# Patient Record
Sex: Male | Born: 1950 | Race: Black or African American | Hispanic: No | Marital: Married | Smoking: Never smoker
Health system: Southern US, Community
[De-identification: ages and names within clinical notes are randomized; demographics above are authoritative.]

## PROBLEM LIST (undated history)

## (undated) DIAGNOSIS — I10 Essential (primary) hypertension: Secondary | ICD-10-CM

## (undated) DIAGNOSIS — E119 Type 2 diabetes mellitus without complications: Secondary | ICD-10-CM

## (undated) DIAGNOSIS — E785 Hyperlipidemia, unspecified: Secondary | ICD-10-CM

## (undated) HISTORY — PX: ROTATOR CUFF REPAIR: SHX139

## (undated) HISTORY — PX: KNEE SURGERY: SHX244

## (undated) HISTORY — PX: OTHER SURGICAL HISTORY: SHX169

---

## 2007-11-19 ENCOUNTER — Ambulatory Visit: Payer: Self-pay | Admitting: Family Medicine

## 2010-11-01 ENCOUNTER — Ambulatory Visit: Payer: Self-pay | Admitting: Internal Medicine

## 2018-03-11 ENCOUNTER — Emergency Department: Payer: Medicare Other

## 2018-03-11 ENCOUNTER — Other Ambulatory Visit: Payer: Self-pay

## 2018-03-11 ENCOUNTER — Observation Stay
Admission: EM | Admit: 2018-03-11 | Discharge: 2018-03-12 | Disposition: A | Discharge status: Home / Self Care | Payer: Medicare Other | Attending: Internal Medicine | Admitting: Internal Medicine

## 2018-03-11 ENCOUNTER — Encounter: Payer: Self-pay | Admitting: Emergency Medicine

## 2018-03-11 DIAGNOSIS — R079 Chest pain, unspecified: Secondary | ICD-10-CM | POA: Diagnosis not present

## 2018-03-11 DIAGNOSIS — Z79899 Other long term (current) drug therapy: Secondary | ICD-10-CM | POA: Insufficient documentation

## 2018-03-11 DIAGNOSIS — E119 Type 2 diabetes mellitus without complications: Secondary | ICD-10-CM | POA: Insufficient documentation

## 2018-03-11 DIAGNOSIS — E782 Mixed hyperlipidemia: Secondary | ICD-10-CM | POA: Insufficient documentation

## 2018-03-11 DIAGNOSIS — Z7984 Long term (current) use of oral hypoglycemic drugs: Secondary | ICD-10-CM | POA: Diagnosis not present

## 2018-03-11 DIAGNOSIS — R0789 Other chest pain: Secondary | ICD-10-CM | POA: Diagnosis present

## 2018-03-11 DIAGNOSIS — K219 Gastro-esophageal reflux disease without esophagitis: Secondary | ICD-10-CM | POA: Diagnosis not present

## 2018-03-11 DIAGNOSIS — I10 Essential (primary) hypertension: Secondary | ICD-10-CM | POA: Diagnosis not present

## 2018-03-11 DIAGNOSIS — R0989 Other specified symptoms and signs involving the circulatory and respiratory systems: Secondary | ICD-10-CM

## 2018-03-11 HISTORY — DX: Essential (primary) hypertension: I10

## 2018-03-11 HISTORY — DX: Hyperlipidemia, unspecified: E78.5

## 2018-03-11 LAB — CBC
HCT: 39.2 % (ref 39.0–52.0)
Hemoglobin: 12.4 g/dL — ABNORMAL LOW (ref 13.0–17.0)
MCH: 31.4 pg (ref 26.0–34.0)
MCHC: 31.6 g/dL (ref 30.0–36.0)
MCV: 99.2 fL (ref 80.0–100.0)
Platelets: 184 10*3/uL (ref 150–400)
RBC: 3.95 MIL/uL — ABNORMAL LOW (ref 4.22–5.81)
RDW: 12 % (ref 11.5–15.5)
WBC: 10.1 10*3/uL (ref 4.0–10.5)
nRBC: 0 % (ref 0.0–0.2)

## 2018-03-11 LAB — BASIC METABOLIC PANEL
Anion gap: 7 (ref 5–15)
BUN: 20 mg/dL (ref 8–23)
CO2: 26 mmol/L (ref 22–32)
Calcium: 8.7 mg/dL — ABNORMAL LOW (ref 8.9–10.3)
Chloride: 105 mmol/L (ref 98–111)
Creatinine, Ser: 0.89 mg/dL (ref 0.61–1.24)
GFR calc Af Amer: >60 mL/min (ref >60)
GLUCOSE: 192 mg/dL — AB (ref 70–99)
POTASSIUM: 3.5 mmol/L (ref 3.5–5.1)
Sodium: 138 mmol/L (ref 135–145)

## 2018-03-11 LAB — HEPATIC FUNCTION PANEL
ALK PHOS: 60 U/L (ref 38–126)
ALT: 27 U/L (ref 0–44)
AST: 26 U/L (ref 15–41)
Albumin: 4 g/dL (ref 3.5–5.0)
BILIRUBIN DIRECT: 0.1 mg/dL (ref 0.0–0.2)
BILIRUBIN INDIRECT: 0.6 mg/dL (ref 0.3–0.9)
BILIRUBIN TOTAL: 0.7 mg/dL (ref 0.3–1.2)
Total Protein: 7.3 g/dL (ref 6.5–8.1)

## 2018-03-11 LAB — TROPONIN I

## 2018-03-11 LAB — LIPASE, BLOOD: Lipase: 31 U/L (ref 11–51)

## 2018-03-11 MED ORDER — DIAZEPAM 2 MG PO TABS: 2.0000 mg | ORAL_TABLET | Freq: Once | ORAL | Status: DC

## 2018-03-11 MED ORDER — LIDOCAINE HCL (PF) 1 % IJ SOLN
0.50 | INTRAMUSCULAR | Status: DC
Start: ? — End: 2018-03-11

## 2018-03-11 MED ORDER — ASPIRIN 81 MG PO CHEW
CHEWABLE_TABLET | ORAL | Status: AC
  Administered 2018-03-11: 324 mg via ORAL
  Filled 2018-03-11: qty 4

## 2018-03-11 MED ORDER — HYDROMORPHONE HCL 1 MG/ML IJ SOLN
INTRAMUSCULAR | Status: AC
  Administered 2018-03-11: 1 mg via INTRAVENOUS
  Filled 2018-03-11: qty 1

## 2018-03-11 MED ORDER — ONDANSETRON HCL 4 MG/2ML IJ SOLN
INTRAMUSCULAR | Status: AC
  Administered 2018-03-11: 4 mg via INTRAVENOUS
  Filled 2018-03-11: qty 2

## 2018-03-11 MED ORDER — FAMOTIDINE IN NACL 20-0.9 MG/50ML-% IV SOLN
20.0000 mg | Freq: Once | INTRAVENOUS | Status: AC
  Administered 2018-03-11: 20 mg via INTRAVENOUS

## 2018-03-11 MED ORDER — MORPHINE SULFATE (PF) 4 MG/ML IV SOLN
INTRAVENOUS | Status: AC
  Administered 2018-03-11: 4 mg via INTRAVENOUS
  Filled 2018-03-11: qty 1

## 2018-03-11 MED ORDER — FAMOTIDINE IN NACL 20-0.9 MG/50ML-% IV SOLN
INTRAVENOUS | Status: AC
  Administered 2018-03-11: 20 mg via INTRAVENOUS
  Filled 2018-03-11: qty 50

## 2018-03-11 MED ORDER — SODIUM CHLORIDE FLUSH 0.9 % IV SOLN
5.00 | INTRAVENOUS | Status: DC
Start: ? — End: 2018-03-11

## 2018-03-11 MED ORDER — HYDROMORPHONE HCL 1 MG/ML IJ SOLN
1.0000 mg | Freq: Once | INTRAMUSCULAR | Status: AC
  Administered 2018-03-11: 1 mg via INTRAVENOUS

## 2018-03-11 MED ORDER — ASPIRIN 81 MG PO CHEW
324.0000 mg | CHEWABLE_TABLET | Freq: Once | ORAL | Status: AC
  Administered 2018-03-11: 324 mg via ORAL

## 2018-03-11 MED ORDER — IOPAMIDOL (ISOVUE-370) INJECTION 76%
75.0000 mL | Freq: Once | INTRAVENOUS | Status: AC | PRN
  Administered 2018-03-11: 75 mL via INTRAVENOUS

## 2018-03-11 MED ORDER — ONDANSETRON HCL 4 MG/2ML IJ SOLN
4.0000 mg | Freq: Once | INTRAMUSCULAR | Status: AC
  Administered 2018-03-11: 4 mg via INTRAVENOUS

## 2018-03-11 MED ORDER — MORPHINE SULFATE (PF) 4 MG/ML IV SOLN
4.0000 mg | Freq: Once | INTRAVENOUS | Status: AC
  Administered 2018-03-11: 4 mg via INTRAVENOUS

## 2018-03-11 NOTE — ED Provider Notes (Signed)
[facility] Emergency Department Provider Note   ____________________________________________   First MD Initiated Contact with Patient 03/11/18 2312     (approximate)  I have reviewed the triage vital signs and the nursing notes.   HISTORY  Chief Complaint Chest Pain    HPI Tyrone Jarvis is a 67 y.o. male who presents to the ED from home with a chief complaint of chest pain.  Patient reports left chest pain onset approximately 8:30 PM while at rest.  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  Had similar pain on 10/29 where he was evaluated in the [facility] ED with negative troponins.  Last stress test 5 years ago which was normal per patient.  No personal history of coronary artery disease.  Denies recent fever, chills, shortness of breath, abdominal pain, nausea, vomiting.  Denies recent travel, trauma or hormone use.   Past Medical History:  Diagnosis Date  . Hyperlipidemia   . Hypertension     Patient Active Problem List   Diagnosis Date Noted  . Chest pain 03/12/2018    History reviewed. No pertinent surgical history.  Prior to Admission medications   Medication Sig Start Date End Date Taking? Authorizing Provider  cyanocobalamin (,VITAMIN B-12,) 1000 MCG/ML injection Inject 1 mL into the muscle every 30 (thirty) days. Takes on the 25th of each month 03/03/18  Yes [provider]  Fluocinolone Acetonide Scalp 0.01 % OIL Apply 1 application topically 2 (two) times daily. 01/28/18  Yes [provider]  ketoconazole (NIZORAL) 2 % cream Apply 1 application topically 2 (two) times daily. 03/03/18  Yes [provider]  latanoprost (XALATAN) 0.005 % ophthalmic solution Place 1 drop into both eyes at bedtime. 03/03/18  Yes [provider]  losartan (COZAAR) 25 MG tablet Take 25 mg by mouth daily. 03/06/18  Yes [provider]  meloxicam (MOBIC) 15 MG tablet Take 15 mg by mouth daily as needed for pain. 01/03/18  Yes [provider]  metFORMIN (GLUCOPHAGE) 500 MG tablet Take 500 mg by mouth daily with breakfast. 03/06/18  Yes [provider]  Multiple Vitamin (MULTIVITAMIN) tablet Take 1 tablet by mouth daily.   Yes [provider]  Omega-3 Fatty Acids (FISH OIL) 1000 MG CAPS Take 1 capsule by mouth 2 (two) times daily.   Yes [provider]  pantoprazole (PROTONIX) 40 MG tablet Take 40 mg by mouth daily. 03/10/18  Yes [provider]  rosuvastatin (CRESTOR) 5 MG tablet Take 5 mg by mouth at bedtime. 03/07/18  Yes [provider]  sildenafil (REVATIO) 20 MG tablet Take 20 mg by mouth daily as needed (erectile dysfunction).  03/03/18  Yes [provider]  traZODone (DESYREL) 50 MG tablet Take 50 mg by mouth at bedtime. 03/10/18  Yes [provider]  vitamin E 200 UNIT capsule Take 200 Units by mouth daily.   Yes [provider]    Allergies Patient has no known allergies.  Family history CAD  Social History Social History   Tobacco Use  . Smoking status: Never Smoker  . Smokeless tobacco: Never Used  Substance Use Topics  . Alcohol use: Never    Frequency: Never  . Drug use: Not on file    Review of Systems  Constitutional: No fever/chills Eyes: No visual changes. ENT: No sore throat. Cardiovascular: Positive for chest pain. Respiratory:  Denies shortness of breath. Gastrointestinal: No abdominal pain.  No nausea, no vomiting.  No diarrhea.  No constipation. Genitourinary: Negative for dysuria. Musculoskeletal: Negative for back pain. Skin: Negative for rash. Neurological: Negative for headaches, focal weakness or numbness.   ____________________________________________   PHYSICAL EXAM:  VITAL SIGNS: ED Triage Vitals  Enc Vitals Group     BP 03/11/18 2306 (!) 142/66     Pulse Rate 03/11/18 2306  88     Resp 03/11/18 2306 20     Temp 03/11/18 2306 97.9 F (36.6 C)     Temp Source 03/11/18 2306 Oral     SpO2 03/11/18 2306 98 %     Weight 03/11/18 2304 248 lb (112.5 kg)     Height 03/11/18 2304 6\' 3"  (1.905 m)     Head Circumference --      Peak Flow --      Pain Score 03/11/18 2304 10     Pain Loc --      Pain Edu? --      Excl. in GC? --     Constitutional: Alert and oriented. Well appearing and in mild to moderate acute distress. Eyes: Conjunctivae are normal. PERRL. EOMI. Head: Atraumatic. Nose: No congestion/rhinnorhea. Mouth/Throat: Mucous membranes are moist.  Oropharynx non-erythematous. Neck: No stridor.   Cardiovascular: Normal rate, regular rhythm. Grossly normal heart sounds.  Good peripheral circulation. Respiratory: Normal respiratory effort.  No retractions. Lungs CTAB.  Left anterior chest at pectoralis muscle tender to palpation and with movement of trunk. Gastrointestinal: Soft and nontender to light or deep palpation. No distention. No abdominal bruits. No CVA tenderness. Musculoskeletal: No lower extremity tenderness nor edema.  No joint effusions. Neurologic:  Normal speech and language. No gross focal neurologic deficits are appreciated. No gait instability. Skin:  Skin is warm, dry and intact. No rash noted. Psychiatric: Mood and affect are normal. Speech and behavior are normal.  ____________________________________________   LABS (all labs ordered are listed, but only abnormal results are displayed)  Labs Reviewed  BASIC METABOLIC PANEL - Abnormal; Notable for the following components:      Result Value   Glucose, Bld 192 (*)    Calcium 8.7 (*)    All other components within normal limits  CBC - Abnormal; Notable for the following components:   RBC 3.95 (*)    Hemoglobin 12.4 (*)    All other components within normal limits  TROPONIN I  HEPATIC FUNCTION PANEL  LIPASE, BLOOD  BRAIN NATRIURETIC PEPTIDE  CK  TSH  TROPONIN I  HEMOGLOBIN  A1C  TROPONIN I  TROPONIN I   ____________________________________________  EKG  ED ECG REPORT I, Analiya Porco J, the attending physician, personally viewed and interpreted this ECG.   Date: 03/11/2018  EKG Time: 2300  Rate: 84  Rhythm: normal EKG, normal sinus rhythm  Axis: Normal  Intervals:none  ST&T Change: T wave inversion in inferior leads EKG from 10/29 at [facility] interpreted as normal sinus rhythm, nonspecific T wave abnormalities, LVH (I am unable to directly visualize this EKG) ____________________________________________  RADIOLOGY  ED MD interpretation: Pulmonary vascular congestion: No PE  Official radiology report(s): Ct Angio Chest Pe W/cm &/or Wo Cm  Result Date: 03/12/2018 CLINICAL DATA:  Excruciating chest pain beginning at 2030 hours. EXAM: CT ANGIOGRAPHY CHEST WITH CONTRAST TECHNIQUE: Multidetector CT imaging of the chest was performed using the standard protocol during bolus administration of intravenous contrast. Multiplanar CT image reconstructions and MIPs were obtained to evaluate the vascular anatomy. CONTRAST:  75mL ISOVUE-370 IOPAMIDOL (ISOVUE-370) INJECTION 76% COMPARISON:  Chest radiograph March 11, 2018 FINDINGS: Mild respiratory motion degraded examination. CARDIOVASCULAR: Suboptimal contrast opacification of the pulmonary artery's (205 Hounsfield units, target is 250 Hounsfield units. Main pulmonary artery is not enlarged. No pulmonary arterial filling defects to the level of the subsegmental branches. Heart size is mildly enlarged. Mild coronary artery calcifications. Small pericardial effusion. Thoracic aorta is normal course and caliber, unremarkable. Pulmonary venous congestion. MEDIASTINUM/NODES: No lymphadenopathy by CT size criteria. Mild gas distended distal esophagus associated with reflux. LUNGS/PLEURA: Tracheobronchial tree is patent, no pneumothorax. Bibasilar atelectasis. No pleural effusion or focal consolidation. UPPER ABDOMEN: Non-acute.  MUSCULOSKELETAL: Non-acute.  ACDF.  The foot Review of the MIP images confirms the above findings. IMPRESSION: 1. No acute pulmonary embolism. 2. Mild cardiomegaly and mild pulmonary edema. Electronically Signed   By: Awilda Metro M.D.   On: 03/12/2018 00:28   Dg Chest Portable 1 View  Result Date: 03/11/2018 CLINICAL DATA:  Excruciating chest pain since 2030 hours tonight. EXAM: PORTABLE CHEST 1 VIEW COMPARISON:  11/01/2010 FINDINGS: Low lung volumes with borderline cardiomegaly. Nonaneurysmal thoracic aorta. There is bibasilar atelectasis. Blunting of the costophrenic angles may reflect small pleural effusions. There is mild central vascular congestion. ACDF hardware noted of the included lower cervical spine. Surgical anchors project over the right humeral head. No acute osseous abnormality. No pneumothorax. IMPRESSION: Low lung volumes with bibasilar atelectasis and mild central vascular congestion. Probable small pleural effusions. Stable borderline cardiomegaly. Electronically Signed   By: Tollie Eth M.D.   On: 03/11/2018 23:35    ____________________________________________   PROCEDURES  Procedure(s) performed: None  Procedures  Critical Care performed: Yes, see critical care note(s)   CRITICAL CARE Performed by: Irean Hong   Total critical care time: 30 minutes  Critical care time was exclusive of separately billable procedures and treating other patients.  Critical care was necessary to treat or prevent imminent or life-threatening deterioration.  Critical care was time spent personally by me on the following activities: development of treatment plan with patient and/or surrogate as well as nursing, discussions with consultants, evaluation of patient's response to treatment, examination of patient, obtaining history from patient or surrogate, ordering and performing treatments and interventions, ordering and review of laboratory studies, ordering and review of radiographic  studies, pulse oximetry and re-evaluation of patient's condition.  ____________________________________________   INITIAL IMPRESSION / ASSESSMENT AND PLAN / ED COURSE  As part of my medical decision making, I reviewed the following data within the electronic MEDICAL RECORD NUMBER History obtained from family, Nursing notes reviewed and incorporated, Labs reviewed, EKG interpreted, Old chart reviewed, Radiograph reviewed and Notes from prior ED visits   67 year old male with hypertension hyperlipidemia who presents with left chest pain. Differential diagnosis includes, but is not limited to, ACS, aortic dissection, pulmonary embolism, cardiac tamponade, pneumothorax, pneumonia, pericarditis, myocarditis, GI-related causes including esophagitis/gastritis, and musculoskeletal chest wall pain.    In addition to basic cardiac panel, will check LFTs and lipase.  Aspirin, 4 mg IV morphine and 4 mg IV Zofran will be administered.  Clinical Course as of Mar 12 528  Caleen Essex Mar 11, 2018  2351 Patient continued to complain of severe chest pain.  1 mg IV Dilaudid was given which is now taking effect.  Patient going to CT scan.   [JS]  Sat Mar 12, 2018  0146 Updated patient and family members on CT imaging results.  Pain improved to 5/10 after IV Dilaudid.  This did make patient  hypoxic with room air saturation of 89%; on 2 L nasal cannula oxygen he is 97%.  Concern for recurrent chest pain this week with new pulmonary vascular congestion.  Will administer 20 mg IV Lasix for diuresis.  Discussed with hospitalist to evaluate patient in the emergency department for admission.   [JS]    Clinical Course User Index [JS] Irean Hong, MD     ____________________________________________   FINAL CLINICAL IMPRESSION(S) / ED DIAGNOSES  Final diagnoses:  Nonspecific chest pain  Pulmonary vascular congestion     ED Discharge Orders    None       Note:  This document was prepared using Dragon voice  recognition software and may include unintentional dictation errors.    Irean Hong, MD 03/12/18 [phone]

## 2018-03-11 NOTE — ED Triage Notes (Signed)
Patient to ER for "excrutiating" chest pain since approx 2030 tonight. Patient denies any shortness of breath or diaphoresis. Patient states he took BP at home, was 157/93.

## 2018-03-12 ENCOUNTER — Other Ambulatory Visit: Payer: Self-pay

## 2018-03-12 DIAGNOSIS — R079 Chest pain, unspecified: Secondary | ICD-10-CM | POA: Diagnosis not present

## 2018-03-12 LAB — TSH: TSH: 1.912 u[IU]/mL (ref 0.350–4.500)

## 2018-03-12 LAB — BRAIN NATRIURETIC PEPTIDE: B Natriuretic Peptide: 22 pg/mL (ref 0.0–100.0)

## 2018-03-12 LAB — HEMOGLOBIN A1C
Hgb A1c MFr Bld: 5.8 % — ABNORMAL HIGH (ref 4.8–5.6)
Mean Plasma Glucose: 119.76 mg/dL

## 2018-03-12 LAB — CK: Total CK: 319 U/L (ref 49–397)

## 2018-03-12 LAB — GLUCOSE, CAPILLARY
Glucose-Capillary: 193 mg/dL — ABNORMAL HIGH (ref 70–99)
Glucose-Capillary: 104 mg/dL — ABNORMAL HIGH (ref 70–99)

## 2018-03-12 LAB — TROPONIN I: Troponin I: <0.03 ng/mL (ref <0.03)

## 2018-03-12 MED ORDER — ONDANSETRON HCL 4 MG/2ML IJ SOLN: 4.0000 mg | Freq: Four times a day (QID) | INTRAMUSCULAR | Status: DC | PRN

## 2018-03-12 MED ORDER — INSULIN ASPART 100 UNIT/ML ~~LOC~~ SOLN: 0.0000 [IU] | Freq: Every day | SUBCUTANEOUS | Status: DC

## 2018-03-12 MED ORDER — ACETAMINOPHEN 325 MG PO TABS: 650.0000 mg | ORAL_TABLET | Freq: Four times a day (QID) | ORAL | Status: DC | PRN

## 2018-03-12 MED ORDER — TRAZODONE HCL 50 MG PO TABS: 50.0000 mg | ORAL_TABLET | Freq: Every day | ORAL | Status: DC

## 2018-03-12 MED ORDER — SODIUM CHLORIDE 0.9% FLUSH: 3.0000 mL | Freq: Two times a day (BID) | INTRAVENOUS | Status: DC

## 2018-03-12 MED ORDER — DOCUSATE SODIUM 100 MG PO CAPS
100.0000 mg | ORAL_CAPSULE | Freq: Two times a day (BID) | ORAL | Status: DC
  Administered 2018-03-12: 100 mg via ORAL
  Filled 2018-03-12: qty 1

## 2018-03-12 MED ORDER — BISACODYL 5 MG PO TBEC
5.0000 mg | DELAYED_RELEASE_TABLET | Freq: Every day | ORAL | Status: DC | PRN
  Administered 2018-03-12: 5 mg via ORAL
  Filled 2018-03-12: qty 1

## 2018-03-12 MED ORDER — PANTOPRAZOLE SODIUM 40 MG PO TBEC: 40.0000 mg | DELAYED_RELEASE_TABLET | Freq: Every day | ORAL | Status: DC

## 2018-03-12 MED ORDER — PANTOPRAZOLE SODIUM 40 MG PO TBEC: 40.0000 mg | DELAYED_RELEASE_TABLET | Freq: Every day | ORAL | 0 refills | Status: DC

## 2018-03-12 MED ORDER — ONDANSETRON HCL 4 MG PO TABS: 4.0000 mg | ORAL_TABLET | Freq: Four times a day (QID) | ORAL | Status: DC | PRN

## 2018-03-12 MED ORDER — FAMOTIDINE 20 MG PO TABS
20.0000 mg | ORAL_TABLET | Freq: Two times a day (BID) | ORAL | Status: DC
  Administered 2018-03-12: 20 mg via ORAL
  Filled 2018-03-12: qty 1

## 2018-03-12 MED ORDER — NITROGLYCERIN 0.3 MG SL SUBL: 0.3000 mg | SUBLINGUAL_TABLET | SUBLINGUAL | 0 refills | Status: DC | PRN

## 2018-03-12 MED ORDER — LOSARTAN POTASSIUM 25 MG PO TABS
25.0000 mg | ORAL_TABLET | Freq: Every day | ORAL | Status: DC
  Administered 2018-03-12: 25 mg via ORAL
  Filled 2018-03-12: qty 1

## 2018-03-12 MED ORDER — ENOXAPARIN SODIUM 40 MG/0.4ML ~~LOC~~ SOLN: 40.0000 mg | SUBCUTANEOUS | Status: DC

## 2018-03-12 MED ORDER — INSULIN ASPART 100 UNIT/ML ~~LOC~~ SOLN
0.0000 [IU] | Freq: Three times a day (TID) | SUBCUTANEOUS | Status: DC
  Administered 2018-03-12: 2 [IU] via SUBCUTANEOUS
  Filled 2018-03-12: qty 1

## 2018-03-12 MED ORDER — FUROSEMIDE 10 MG/ML IJ SOLN
20.0000 mg | Freq: Once | INTRAMUSCULAR | Status: AC
  Administered 2018-03-12: 20 mg via INTRAVENOUS
  Filled 2018-03-12: qty 4

## 2018-03-12 MED ORDER — ROSUVASTATIN CALCIUM 5 MG PO TABS: 5.0000 mg | ORAL_TABLET | Freq: Every day | ORAL | Status: DC

## 2018-03-12 MED ORDER — LATANOPROST 0.005 % OP SOLN
1.0000 [drp] | Freq: Every day | OPHTHALMIC | Status: DC
  Filled 2018-03-12: qty 2.5

## 2018-03-12 MED ORDER — ACETAMINOPHEN 650 MG RE SUPP: 650.0000 mg | Freq: Four times a day (QID) | RECTAL | Status: DC | PRN

## 2018-03-12 NOTE — Progress Notes (Signed)
Pt complains of constipation. MD notified. Orders received. I will continue to assess.

## 2018-03-12 NOTE — H&P (Signed)
Tyrone Jarvis is an 67 y.o. male.    Chief Complaint: Chest pain HPI: The patient with past medical history of hypertension and hyperlipidemia presents the emergency department complaining of chest pain.  His pain began at rest while eating dinner.  The pain was sharp and squeezing in character.  The patient denies shortness of breath or chest pressure.  He had similar symptoms 5 days ago when he was evaluated at [facility].  He received a GI cocktail which he states relieved his pain to some degree.  He reports that his pain tonight is different.  He denies nausea, vomiting or diaphoresis.  His blood pressure was 191 systolic when his pain began.  It lasted at its worst intensity for approximately 40 minutes or more.  He continues to have some pain now which is like a dull ache.  The patient had a exercise stress test approximately 4 years ago which she states he completed without any difficulty.  Tonight patient received aspirin as well as morphine before his pain some.  EKG is negative for myocardial infarction and his troponin is negative at this time.  Nonetheless the emergency department staff called the hospitalist service for further evaluation.  Past Medical History:  Diagnosis Date  . Hyperlipidemia   . Hypertension     Past Surgical History:  Procedure Laterality Date  . none     None  Family History  Problem Relation Age of Onset  . Diabetes Mellitus II Mother   . CAD Neg Hx    Social History:  reports that he has never smoked. He has never used smokeless tobacco. He reports that he does not drink alcohol. His drug history is not on file.  Allergies: No Known Allergies  Medications Prior to Admission  Medication Sig Dispense Refill  . cyanocobalamin (,VITAMIN B-12,) 1000 MCG/ML injection Inject 1 mL into the muscle every 30 (thirty) days. Takes on the 25th of each month  12  . Fluocinolone Acetonide Scalp 0.01 % OIL Apply 1 application topically 2 (two) times daily.  11  .  ketoconazole (NIZORAL) 2 % cream Apply 1 application topically 2 (two) times daily.  6  . latanoprost (XALATAN) 0.005 % ophthalmic solution Place 1 drop into both eyes at bedtime.  11  . losartan (COZAAR) 25 MG tablet Take 25 mg by mouth daily.  3  . meloxicam (MOBIC) 15 MG tablet Take 15 mg by mouth daily as needed for pain.  1  . metFORMIN (GLUCOPHAGE) 500 MG tablet Take 500 mg by mouth daily with breakfast.  3  . Multiple Vitamin (MULTIVITAMIN) tablet Take 1 tablet by mouth daily.    . Omega-3 Fatty Acids (FISH OIL) 1000 MG CAPS Take 1 capsule by mouth 2 (two) times daily.    . pantoprazole (PROTONIX) 40 MG tablet Take 40 mg by mouth daily.  5  . rosuvastatin (CRESTOR) 5 MG tablet Take 5 mg by mouth at bedtime.  3  . sildenafil (REVATIO) 20 MG tablet Take 20 mg by mouth daily as needed (erectile dysfunction).   6  . traZODone (DESYREL) 50 MG tablet Take 50 mg by mouth at bedtime.  3  . vitamin E 200 UNIT capsule Take 200 Units by mouth daily.      Results for orders placed or performed during the hospital encounter of 03/11/18 (from the past 48 hour(s))  Basic metabolic panel     Status: Abnormal   Collection Time: 03/11/18 11:14 PM  Result Value Ref Range  Sodium 138 135 - 145 mmol/L   Potassium 3.5 3.5 - 5.1 mmol/L   Chloride 105 98 - 111 mmol/L   CO2 26 22 - 32 mmol/L   Glucose, Bld 192 (H) 70 - 99 mg/dL   BUN 20 8 - 23 mg/dL   Creatinine, Ser 0.89 0.61 - 1.24 mg/dL   Calcium 8.7 (L) 8.9 - 10.3 mg/dL   GFR calc non Af Amer >60 >60 mL/min   GFR calc Af Amer >60 >60 mL/min    Comment: (NOTE) The eGFR has been calculated using the CKD EPI equation. This calculation has not been validated in all clinical situations. eGFR's persistently <60 mL/min signify possible Chronic Kidney Disease.    Anion gap 7 5 - 15    Comment: Performed at [facility], [address] [place], [place] [address]  CBC     Status: Abnormal   Collection Time: 03/11/18 11:14 PM  Result Value  Ref Range   WBC 10.1 4.0 - 10.5 K/uL   RBC 3.95 (L) 4.22 - 5.81 MIL/uL   Hemoglobin 12.4 (L) 13.0 - 17.0 g/dL   HCT 39.2 39.0 - 52.0 %   MCV 99.2 80.0 - 100.0 fL   MCH 31.4 26.0 - 34.0 pg   MCHC 31.6 30.0 - 36.0 g/dL   RDW 12.0 11.5 - 15.5 %   Platelets 184 150 - 400 K/uL   nRBC 0.0 0.0 - 0.2 %    Comment: Performed at [facility], [address], [place], [place] [address]  Troponin I     Status: None   Collection Time: 03/11/18 11:14 PM  Result Value Ref Range   Troponin I <0.03 <0.03 ng/mL    Comment: Performed at [facility], [address] [place], [place] [address]  Hepatic function panel     Status: None   Collection Time: 03/11/18 11:14 PM  Result Value Ref Range   Total Protein 7.3 6.5 - 8.1 g/dL   Albumin 4.0 3.5 - 5.0 g/dL   AST 26 15 - 41 U/L   ALT 27 0 - 44 U/L   Alkaline Phosphatase 60 38 - 126 U/L   Total Bilirubin 0.7 0.3 - 1.2 mg/dL   Bilirubin, Direct 0.1 0.0 - 0.2 mg/dL   Indirect Bilirubin 0.6 0.3 - 0.9 mg/dL    Comment: Performed at [facility], [address] [place], [place] [address]  Lipase, blood     Status: None   Collection Time: 03/11/18 11:14 PM  Result Value Ref Range   Lipase 31 11 - 51 U/L    Comment: Performed at [facility], [address] [place], [place] [address]  Brain natriuretic peptide     Status: None   Collection Time: 03/11/18 11:14 PM  Result Value Ref Range   B Natriuretic Peptide 22.0 0.0 - 100.0 pg/mL    Comment: Performed at [facility], [address] [place], [place] [address]  CK     Status: None   Collection Time: 03/11/18 11:14 PM  Result Value Ref Range   Total CK 319 49 - 397 U/L    Comment: Performed at [facility], [address] [place],  [address]  TSH     Status: None   Collection Time: 03/12/18  4:14 AM  Result Value Ref Range   TSH 1.912 0.350 - 4.500 uIU/mL    Comment: Performed by a 3rd Generation assay with a functional  sensitivity of <=0.01 uIU/mL. Performed  at [facility], [address] [place], [place] [address]   Troponin I     Status: None   Collection Time: 03/12/18  4:14 AM  Result Value Ref Range   Troponin I <0.03 <0.03 ng/mL    Comment: Performed at [facility], [address] [place], [place] [address]  Glucose, capillary     Status: Abnormal   Collection Time: 03/12/18  7:53 AM  Result Value Ref Range   Glucose-Capillary 193 (H) 70 - 99 mg/dL   Comment 1 Notify RN    Ct Angio Chest Pe W/cm &/or Wo Cm  Result Date: 03/12/2018 CLINICAL DATA:  Excruciating chest pain beginning at 2030 hours. EXAM: CT ANGIOGRAPHY CHEST WITH CONTRAST TECHNIQUE: Multidetector CT imaging of the chest was performed using the standard protocol during bolus administration of intravenous contrast. Multiplanar CT image reconstructions and MIPs were obtained to evaluate the vascular anatomy. CONTRAST:  80m ISOVUE-370 IOPAMIDOL (ISOVUE-370) INJECTION 76% COMPARISON:  Chest radiograph March 11, 2018 FINDINGS: Mild respiratory motion degraded examination. CARDIOVASCULAR: Suboptimal contrast opacification of the pulmonary artery's (205 Hounsfield units, target is 250 Hounsfield units. Main pulmonary artery is not enlarged. No pulmonary arterial filling defects to the level of the subsegmental branches. Heart size is mildly enlarged. Mild coronary artery calcifications. Small pericardial effusion. Thoracic aorta is normal course and caliber, unremarkable. Pulmonary venous congestion. MEDIASTINUM/NODES: No lymphadenopathy by CT size criteria. Mild gas distended distal esophagus associated with reflux. LUNGS/PLEURA: Tracheobronchial tree is patent, no pneumothorax. Bibasilar atelectasis. No pleural effusion or focal consolidation. UPPER ABDOMEN: Non-acute. MUSCULOSKELETAL: Non-acute.  ACDF.  The foot Review of the MIP images confirms the above findings. IMPRESSION: 1. No acute pulmonary embolism. 2. Mild  cardiomegaly and mild pulmonary edema. Electronically Signed   By: CElon AlasM.D.   On: 03/12/2018 00:28   Dg Chest Portable 1 View  Result Date: 03/11/2018 CLINICAL DATA:  Excruciating chest pain since 2030 hours tonight. EXAM: PORTABLE CHEST 1 VIEW COMPARISON:  11/01/2010 FINDINGS: Low lung volumes with borderline cardiomegaly. Nonaneurysmal thoracic aorta. There is bibasilar atelectasis. Blunting of the costophrenic angles may reflect small pleural effusions. There is mild central vascular congestion. ACDF hardware noted of the included lower cervical spine. Surgical anchors project over the right humeral head. No acute osseous abnormality. No pneumothorax. IMPRESSION: Low lung volumes with bibasilar atelectasis and mild central vascular congestion. Probable small pleural effusions. Stable borderline cardiomegaly. Electronically Signed   By: DAshley RoyaltyM.D.   On: 03/11/2018 23:35    Review of Systems  Constitutional: Negative for chills and fever.  HENT: Negative for sore throat and tinnitus.   Eyes: Negative for blurred vision and redness.  Respiratory: Negative for cough and shortness of breath.   Cardiovascular: Positive for chest pain. Negative for palpitations, orthopnea and PND.  Gastrointestinal: Negative for abdominal pain, diarrhea, nausea and vomiting.  Genitourinary: Negative for dysuria, frequency and urgency.  Musculoskeletal: Negative for joint pain and myalgias.  Skin: Negative for rash.       No lesions  Neurological: Negative for speech change, focal weakness and weakness.  Endo/Heme/Allergies: Does not bruise/bleed easily.       No temperature intolerance  Psychiatric/Behavioral: Negative for depression and suicidal ideas.    Blood pressure 132/87, pulse 82, temperature 98 F (36.7 C), temperature source Oral, resp. rate 17, height '6\' 3"'$  (1.905 m), weight 112.4 kg, SpO2 98 %. Physical Exam  Constitutional: He is oriented to person, place, and time. He appears  well-developed and well-nourished. No  distress.  HENT:  Head: Normocephalic and atraumatic.  Mouth/Throat: Oropharynx is clear and moist.  Eyes: Pupils are equal, round, and reactive to light. Conjunctivae and EOM are normal. No scleral icterus.  Neck: Normal range of motion. Neck supple. No JVD present. No tracheal deviation present. No thyromegaly present.  Cardiovascular: Normal rate, regular rhythm and normal heart sounds. Exam reveals no gallop and no friction rub.  No murmur heard. GI: Soft. Bowel sounds are normal. He exhibits no distension. There is no tenderness.  Genitourinary:  Genitourinary Comments: Deferred  Musculoskeletal: Normal range of motion. He exhibits no edema.  Lymphadenopathy:    He has no cervical adenopathy.  Neurological: He is alert and oriented to person, place, and time. No cranial nerve deficit.  Skin: Skin is warm and dry. No rash noted. No erythema.  Psychiatric: He has a normal mood and affect. His behavior is normal. Judgment and thought content normal.     Assessment/Plan This is a 67 year old male admitted for chest pain. 1.  Chest pain: Nearly resolved.  Atypical in duration and character.  May be severe reflux.  I have ordered twice daily H2 blockers for the patient.  Continue to follow cardiac biomarkers.  Monitor telemetry.  Consult cardiology. 2.  Hypertension: Controlled at this time.  Resume ARB 3.  Diabetes mellitus type 2: Hold oral hypoglycemic agents.  Sliding cell insulin while hospitalized 4.  Hyperlipidemia: Continue statin therapy 5.  DVT prophylaxis: Lovenox 6.  GI prophylaxis: As above The patient is a full code.  Time spent on admission orders and patient care approximately 45 minutes.  Harrie Foreman, MD 03/12/2018, 8:28 AM

## 2018-03-12 NOTE — ED Notes (Signed)
Pt O2 sats at low 90s high 80s. MD notified and told this nurse to put 2L [place].

## 2018-03-12 NOTE — Discharge Summary (Signed)
Tyrone Jarvis, is a 67 y.o. male  DOB 1950/05/29  MRN [id number].  Admission date:  03/11/2018  Admitting Physician  Arnaldo Natal, MD  Discharge Date:  03/12/2018   Primary MD  Dan Maker, MD  Recommendations for primary care physician for things to follow:   Follow with PCP in 1 week Follow-up with outpatient stress test scheduled for Monday with [facility] health cardiology.   Admission Diagnosis  Pulmonary vascular congestion [R09.89] Nonspecific chest pain [R07.9]   Discharge Diagnosis  Pulmonary vascular congestion [R09.89] Nonspecific chest pain [R07.9]    Active Problems:   Chest pain      Past Medical History:  Diagnosis Date  . Hyperlipidemia   . Hypertension     Past Surgical History:  Procedure Laterality Date  . none         History of present illness and  Hospital Course:     Kindly see H&P for history of present illness and admission details, please review complete Labs, Consult reports and Test reports for all details in brief  HPI  from the history and physical done on the day of admission 67 year old male with history of hypertension, diabetes, hyperlipidemia came in because of chest pain.   Hospital Course  Chest pain at rest, has atypical features: Patient troponins have been negative x3, patient was at [facility] few days ago and had repeated electrocardiograms, troponins which were negative and sent home from [facility], comes back again today because chest pain that started last night, this time patient EKG and troponins have been -2 times.  Because of history of hypertension, diabetes mellitus, hyperlipidemia patient needs a stress test which is already arranged by cardiology on Monday.  Same explained to the patient, will discharge the patient with sublingual nitro as needed for chest  pain. 2.  GERD, history of reflux before, discharged home with PPI daily, 3.  Prediabetes, patient is on diabetes mellitus type 2. 3.  Mixed hyperlipidemia: Continue statins.    Discharge Condition: Stable   Follow UP  Follow-up Information    Dan Maker, MD. Schedule an appointment as soon as possible for a visit in 1 week(s).   Specialty:  Internal Medicine Contact information: [address] [place] [address] [phone]             Discharge Instructions  and  Discharge Medications      Allergies as of 03/12/2018   No Known Allergies     Medication List    TAKE these medications   cyanocobalamin 1000 MCG/ML injection Commonly known as:  (VITAMIN B-12) Inject 1 mL into the muscle every 30 (thirty) days. Takes on the 25th of each month   Fish Oil 1000 MG Caps Take 1 capsule by mouth 2 (two) times daily.   Fluocinolone Acetonide Scalp 0.01 % Oil Apply 1 application topically 2 (two) times daily.   ketoconazole 2 % cream Commonly known as:  NIZORAL Apply 1 application topically 2 (two) times daily.   latanoprost 0.005 % ophthalmic solution Commonly known as:  XALATAN Place 1 drop into both eyes at bedtime.   losartan 25 MG tablet Commonly known as:  COZAAR Take 25 mg by mouth daily.   meloxicam 15 MG tablet Commonly known as:  MOBIC Take 15 mg by mouth daily as needed for pain.   metFORMIN 500 MG tablet Commonly known as:  GLUCOPHAGE Take 500 mg by mouth daily with breakfast.   multivitamin tablet Take 1 tablet  by mouth daily.   nitroGLYCERIN 0.3 MG SL tablet Commonly known as:  NITROSTAT Place 1 tablet (0.3 mg total) under the tongue every 5 (five) minutes as needed for chest pain.   pantoprazole 40 MG tablet Commonly known as:  PROTONIX Take 1 tablet (40 mg total) by mouth daily.   rosuvastatin 5 MG tablet Commonly known as:  CRESTOR Take 5 mg by mouth at bedtime.   sildenafil 20 MG tablet Commonly known as:   REVATIO Take 20 mg by mouth daily as needed (erectile dysfunction).   traZODone 50 MG tablet Commonly known as:  DESYREL Take 50 mg by mouth at bedtime.   vitamin E 200 UNIT capsule Take 200 Units by mouth daily.         Diet and Activity recommendation: See Discharge Instructions above   Consults obtained'cardio  Major procedures and Radiology Reports - PLEASE review detailed and final reports for all details, in brief -      Ct Angio Chest Pe W/cm &/or Wo Cm  Result Date: 03/12/2018 CLINICAL DATA:  Excruciating chest pain beginning at 2030 hours. EXAM: CT ANGIOGRAPHY CHEST WITH CONTRAST TECHNIQUE: Multidetector CT imaging of the chest was performed using the standard protocol during bolus administration of intravenous contrast. Multiplanar CT image reconstructions and MIPs were obtained to evaluate the vascular anatomy. CONTRAST:  75mL ISOVUE-370 IOPAMIDOL (ISOVUE-370) INJECTION 76% COMPARISON:  Chest radiograph March 11, 2018 FINDINGS: Mild respiratory motion degraded examination. CARDIOVASCULAR: Suboptimal contrast opacification of the pulmonary artery's (205 Hounsfield units, target is 250 Hounsfield units. Main pulmonary artery is not enlarged. No pulmonary arterial filling defects to the level of the subsegmental branches. Heart size is mildly enlarged. Mild coronary artery calcifications. Small pericardial effusion. Thoracic aorta is normal course and caliber, unremarkable. Pulmonary venous congestion. MEDIASTINUM/NODES: No lymphadenopathy by CT size criteria. Mild gas distended distal esophagus associated with reflux. LUNGS/PLEURA: Tracheobronchial tree is patent, no pneumothorax. Bibasilar atelectasis. No pleural effusion or focal consolidation. UPPER ABDOMEN: Non-acute. MUSCULOSKELETAL: Non-acute.  ACDF.  The foot Review of the MIP images confirms the above findings. IMPRESSION: 1. No acute pulmonary embolism. 2. Mild cardiomegaly and mild pulmonary edema. Electronically  Signed   By: Awilda Metro M.D.   On: 03/12/2018 00:28   Dg Chest Portable 1 View  Result Date: 03/11/2018 CLINICAL DATA:  Excruciating chest pain since 2030 hours tonight. EXAM: PORTABLE CHEST 1 VIEW COMPARISON:  11/01/2010 FINDINGS: Low lung volumes with borderline cardiomegaly. Nonaneurysmal thoracic aorta. There is bibasilar atelectasis. Blunting of the costophrenic angles may reflect small pleural effusions. There is mild central vascular congestion. ACDF hardware noted of the included lower cervical spine. Surgical anchors project over the right humeral head. No acute osseous abnormality. No pneumothorax. IMPRESSION: Low lung volumes with bibasilar atelectasis and mild central vascular congestion. Probable small pleural effusions. Stable borderline cardiomegaly. Electronically Signed   By: Tollie Eth M.D.   On: 03/11/2018 23:35    Micro Results     No results found for this or any previous visit (from the past 240 hour(s)).     Today   Subjective:   Rudolph Daoust today has no headache,no chest abdominal pain,no new weakness tingling or numbness, feels much better wants to go home today.   Objective:   Blood pressure 132/87, pulse 82, temperature 98 F (36.7 C), temperature source Oral, resp. rate 17, height 6\' 3"  (1.905 m), weight 112.4 kg, SpO2 98 %.   Intake/Output Summary (Last 24 hours) at 03/12/2018 1139 Last data filed at 03/12/2018  0112 Gross per 24 hour  Intake 168.33 ml  Output -  Net 168.33 ml    Exam Awake Alert, Oriented x 3, No new F.N deficits, Normal affect [place].AT,PERRAL Supple Neck,No JVD, No cervical lymphadenopathy appriciated.  Symmetrical Chest wall movement, Good air movement bilaterally, CTAB RRR,No Gallops,Rubs or new Murmurs, No Parasternal Heave +ve B.Sounds, Abd Soft, Non tender, No organomegaly appriciated, No rebound -guarding or rigidity. No Cyanosis, Clubbing or edema, No new Rash or bruise  Data Review   CBC w Diff:  Lab Results   Component Value Date   WBC 10.1 03/11/2018   HGB 12.4 (L) 03/11/2018   HCT 39.2 03/11/2018   PLT 184 03/11/2018    CMP:  Lab Results  Component Value Date   NA 138 03/11/2018   K 3.5 03/11/2018   CL 105 03/11/2018   CO2 26 03/11/2018   BUN 20 03/11/2018   CREATININE 0.89 03/11/2018   PROT 7.3 03/11/2018   ALBUMIN 4.0 03/11/2018   BILITOT 0.7 03/11/2018   ALKPHOS 60 03/11/2018   AST 26 03/11/2018   ALT 27 03/11/2018  .   Total Time in preparing paper work, data evaluation and todays exam - 35 minutes  Katha Hamming M.D on 03/12/2018 at 11:39 AM    Note: This dictation was prepared with Dragon dictation along with smaller phrase technology. Any transcriptional errors that result from this process are unintentional.

## 2018-03-12 NOTE — Plan of Care (Signed)
  Problem: Education: Goal: Knowledge of General Education information will improve Description: Including pain rating scale, medication(s)/side effects and non-pharmacologic comfort measures Outcome: Progressing   Problem: Health Behavior/Discharge Planning: Goal: Ability to manage health-related needs will improve Outcome: Progressing   Problem: Clinical Measurements: Goal: Ability to maintain clinical measurements within normal limits will improve Outcome: Progressing Goal: Diagnostic test results will improve Outcome: Progressing Goal: Cardiovascular complication will be avoided Outcome: Progressing   Problem: Pain Managment: Goal: General experience of comfort will improve Outcome: Progressing   

## 2018-03-12 NOTE — Consult Note (Signed)
Cardiology Consultation:   Patient ID: Tyrone Jarvis MRN: [id number]; DOB: 03/20/1951  Admit date: 03/11/2018 Date of Consult: 03/12/2018  Primary Care Provider: Dan Maker, MD Primary Cardiologist: No primary care provider on file.  Primary Electrophysiologist:  None    Patient Profile:   Tyrone Jarvis is a 67 y.o. male with a hx of hypertension, borderline diabetes, and hyperlipidemia who is being seen today for the evaluation of chest pain at the request of Luberta Mutter. .  History of Present Illness:   Mr. Tyrone Jarvis is a 67 year old gentleman with no past cardiac history.  He is physically active with regular exercise which includes weightlifting and "cardio."  He has never had exertional chest pain or pressure.  The patient developed substernal chest discomfort earlier this week that occurred when he was lying down in bed after eating dinner.  Symptoms persisted and prompted him to seek evaluation at [facility].  He underwent serial EKGs and troponins which were negative.  He was discharged home.  Of note his symptoms resolved with a GI cocktail at that time.  He had recurrent chest discomfort last night, also occurred after dinner when he was lying in bed.  This time the symptoms were more severe in nature and he describes them as a central chest pain that felt like a "ache" and radiated up the left side of the neck.  Symptoms persisted until he got to the hospital.  He was treated with morphine and Dilaudid which did not impact his symptoms.  He states he was then given some other medication and his symptoms resolved.  He did not receive any sublingual nitroglycerin.  Again, serial cardiac enzymes are negative and EKGs show no ischemic ST segment changes.  He is chest pain-free this morning and has no symptoms.  He specifically denies chest pain, chest pressure, shortness of breath, or abdominal discomfort this morning.  He has had no recent heart palpitations,  lightheadedness, or syncope.  He is otherwise been in his normal state of health.  Past Medical History:  Diagnosis Date  . Hyperlipidemia   . Hypertension     Past Surgical History:  Procedure Laterality Date  . none       Home Medications:  Prior to Admission medications   Medication Sig Start Date End Date Taking? Authorizing Provider  cyanocobalamin (,VITAMIN B-12,) 1000 MCG/ML injection Inject 1 mL into the muscle every 30 (thirty) days. Takes on the 25th of each month 03/03/18  Yes [provider]  Fluocinolone Acetonide Scalp 0.01 % OIL Apply 1 application topically 2 (two) times daily. 01/28/18  Yes [provider]  ketoconazole (NIZORAL) 2 % cream Apply 1 application topically 2 (two) times daily. 03/03/18  Yes [provider]  latanoprost (XALATAN) 0.005 % ophthalmic solution Place 1 drop into both eyes at bedtime. 03/03/18  Yes [provider]  losartan (COZAAR) 25 MG tablet Take 25 mg by mouth daily. 03/06/18  Yes [provider]  meloxicam (MOBIC) 15 MG tablet Take 15 mg by mouth daily as needed for pain. 01/03/18  Yes [provider]  metFORMIN (GLUCOPHAGE) 500 MG tablet Take 500 mg by mouth daily with breakfast. 03/06/18  Yes [provider]  Multiple Vitamin (MULTIVITAMIN) tablet Take 1 tablet by mouth daily.   Yes [provider]  Omega-3 Fatty Acids (FISH OIL) 1000 MG CAPS Take 1 capsule by mouth 2 (two) times daily.   Yes [provider]  pantoprazole (PROTONIX) 40 MG tablet  Take 40 mg by mouth daily. 03/10/18  Yes [provider]  rosuvastatin (CRESTOR) 5 MG tablet Take 5 mg by mouth at bedtime. 03/07/18  Yes [provider]  sildenafil (REVATIO) 20 MG tablet Take 20 mg by mouth daily as needed (erectile dysfunction).  03/03/18  Yes [provider]  traZODone (DESYREL) 50 MG tablet Take 50 mg by mouth at bedtime. 03/10/18  Yes [provider]  vitamin E  200 UNIT capsule Take 200 Units by mouth daily.   Yes [provider]    Inpatient Medications: Scheduled Meds: . docusate sodium  100 mg Oral BID  . enoxaparin (LOVENOX) injection  40 mg Subcutaneous Q24H  . famotidine  20 mg Oral BID  . insulin aspart  0-5 Units Subcutaneous QHS  . insulin aspart  0-9 Units Subcutaneous TID WC  . latanoprost  1 drop Both Eyes QHS  . losartan  25 mg Oral Daily  . pantoprazole  40 mg Oral Daily  . rosuvastatin  5 mg Oral QHS  . sodium chloride flush  3 mL Intravenous Q12H  . traZODone  50 mg Oral QHS   Continuous Infusions:  PRN Meds: acetaminophen **OR** acetaminophen, bisacodyl, ondansetron **OR** ondansetron (ZOFRAN) IV  Allergies:   No Known Allergies  Social History:   Social History   Socioeconomic History  . Marital status: Married    Spouse name: Not on file  . Number of children: Not on file  . Years of education: Not on file  . Highest education level: Not on file  Occupational History  . Not on file  Social Needs  . Financial resource strain: Not on file  . Food insecurity:    Worry: Not on file    Inability: Not on file  . Transportation needs:    Medical: Not on file    Non-medical: Not on file  Tobacco Use  . Smoking status: Never Smoker  . Smokeless tobacco: Never Used  Substance and Sexual Activity  . Alcohol use: Never    Frequency: Never  . Drug use: Not on file  . Sexual activity: Not on file  Lifestyle  . Physical activity:    Days per week: Not on file    Minutes per session: Not on file  . Stress: Not on file  Relationships  . Social connections:    Talks on phone: Not on file    Gets together: Not on file    Attends religious service: Not on file    Active member of club or organization: Not on file    Attends meetings of clubs or organizations: Not on file    Relationship status: Not on file  . Intimate partner violence:    Fear of current or ex partner: Not on file    Emotionally  abused: Not on file    Physically abused: Not on file    Forced sexual activity: Not on file  Other Topics Concern  . Not on file  Social History Narrative  . Not on file    Family History:    Family History  Problem Relation Age of Onset  . Diabetes Mellitus II Mother   . CAD Neg Hx      ROS:  Please see the history of present illness.  All other ROS reviewed and negative.     Physical Exam/Data:   Vitals:   03/12/18 0117 03/12/18 0200 03/12/18 0352 03/12/18 0406  BP: (!) 119/91 120/70 132/87   Pulse: 87 84 82  Resp: (!) 23 (!) 24 17   Temp:   98 F (36.7 C)   TempSrc:   Oral   SpO2: 97% 94% 98%   Weight:    112.4 kg  Height:        Intake/Output Summary (Last 24 hours) at 03/12/2018 1053 Last data filed at 03/12/2018 0112 Gross per 24 hour  Intake 168.33 ml  Output -  Net 168.33 ml   Filed Weights   03/11/18 2304 03/12/18 0406  Weight: 112.5 kg 112.4 kg   Body mass index is 30.99 kg/m.  General:  Well nourished, well developed, in no acute distress HEENT: normal Lymph: no adenopathy Neck: no JVD Endocrine:  No thryomegaly Vascular: No carotid bruits; FA pulses 2+ bilaterally, pedal pulses 2+ Cardiac:  normal S1, S2; RRR; 2/6 ejection murmur at the right upper sternal border Lungs:  clear to auscultation bilaterally, no wheezing, rhonchi or rales  Abd: soft, nontender, no hepatomegaly  Ext: no edema Musculoskeletal:  No deformities, BUE and BLE strength normal and equal Skin: warm and dry  Neuro:  CNs 2-12 intact, no focal abnormalities noted Psych:  Normal affect   EKG:  The EKG was personally reviewed and demonstrates: Normal sinus rhythm, occasional PVC, nonspecific T wave abnormality.  Telemetry:  Telemetry was personally reviewed and demonstrates: Normal sinus rhythm with few PVCs  Relevant CV Studies: None  Laboratory Data:  Chemistry Recent Labs  Lab 03/11/18 2314  NA 138  K 3.5  CL 105  CO2 26  GLUCOSE 192*  BUN 20  CREATININE  0.89  CALCIUM 8.7*  GFRNONAA >60  GFRAA >60  ANIONGAP 7    Recent Labs  Lab 03/11/18 2314  PROT 7.3  ALBUMIN 4.0  AST 26  ALT 27  ALKPHOS 60  BILITOT 0.7   Hematology Recent Labs  Lab 03/11/18 2314  WBC 10.1  RBC 3.95*  HGB 12.4*  HCT 39.2  MCV 99.2  MCH 31.4  MCHC 31.6  RDW 12.0  PLT 184   Cardiac Enzymes Recent Labs  Lab 03/11/18 2314 03/12/18 0414  TROPONINI <0.03 <0.03   No results for input(s): TROPIPOC in the last 168 hours.  BNP Recent Labs  Lab 03/11/18 2314  BNP 22.0    DDimer No results for input(s): DDIMER in the last 168 hours.  Radiology/Studies:  Ct Angio Chest Pe W/cm &/or Wo Cm  Result Date: 03/12/2018 CLINICAL DATA:  Excruciating chest pain beginning at 2030 hours. EXAM: CT ANGIOGRAPHY CHEST WITH CONTRAST TECHNIQUE: Multidetector CT imaging of the chest was performed using the standard protocol during bolus administration of intravenous contrast. Multiplanar CT image reconstructions and MIPs were obtained to evaluate the vascular anatomy. CONTRAST:  75mL ISOVUE-370 IOPAMIDOL (ISOVUE-370) INJECTION 76% COMPARISON:  Chest radiograph March 11, 2018 FINDINGS: Mild respiratory motion degraded examination. CARDIOVASCULAR: Suboptimal contrast opacification of the pulmonary artery's (205 Hounsfield units, target is 250 Hounsfield units. Main pulmonary artery is not enlarged. No pulmonary arterial filling defects to the level of the subsegmental branches. Heart size is mildly enlarged. Mild coronary artery calcifications. Small pericardial effusion. Thoracic aorta is normal course and caliber, unremarkable. Pulmonary venous congestion. MEDIASTINUM/NODES: No lymphadenopathy by CT size criteria. Mild gas distended distal esophagus associated with reflux. LUNGS/PLEURA: Tracheobronchial tree is patent, no pneumothorax. Bibasilar atelectasis. No pleural effusion or focal consolidation. UPPER ABDOMEN: Non-acute. MUSCULOSKELETAL: Non-acute.  ACDF.  The foot Review  of the MIP images confirms the above findings. IMPRESSION: 1. No acute pulmonary embolism. 2. Mild cardiomegaly and mild pulmonary  edema. Electronically Signed   By: Awilda Metro M.D.   On: 03/12/2018 00:28   Dg Chest Portable 1 View  Result Date: 03/11/2018 CLINICAL DATA:  Excruciating chest pain since 2030 hours tonight. EXAM: PORTABLE CHEST 1 VIEW COMPARISON:  11/01/2010 FINDINGS: Low lung volumes with borderline cardiomegaly. Nonaneurysmal thoracic aorta. There is bibasilar atelectasis. Blunting of the costophrenic angles may reflect small pleural effusions. There is mild central vascular congestion. ACDF hardware noted of the included lower cervical spine. Surgical anchors project over the right humeral head. No acute osseous abnormality. No pneumothorax. IMPRESSION: Low lung volumes with bibasilar atelectasis and mild central vascular congestion. Probable small pleural effusions. Stable borderline cardiomegaly. Electronically Signed   By: Tollie Eth M.D.   On: 03/11/2018 23:35    Assessment and Plan:   1. Chest pain at rest, some typical and atypical features 2. Hypertension, controlled 3. Mixed hyperlipidemia 4. Borderline diabetes, hemoglobin A1c 5.8  Objective measures for ischemia are negative with normal troponins and BNP.  The patient's chest pain is somewhat atypical and that it is resting chest pain occurring when he is supine after dinner.  I think the most likely etiology is GI in nature with either gastroesophageal reflux or esophageal spasm.  However, the patient does have multiple CV risk factors including type 2 diabetes, hypertension, and hyperlipidemia.  He is now had 2 hospital evaluations for chest pain in the last week.  I have recommended an outpatient exercise stress Myoview scan for further evaluation of chest pain and potential for ischemic heart disease.  I think the patient can be discharged home and have this done as an outpatient.  Recommend discharging him on a  daily proton pump inhibitor and also with as needed sublingual nitroglycerin.  He is given instructions how to use nitroglycerin and when to seek medical attention if his chest pain persists.  All of his questions are answered.  Inc. you for asking [place] to see him in his outpatient stress test will be arranged next week.  [facility] will sign off.   Medication Recommendations:  PPI, NTG as needed Other recommendations (labs, testing, etc):  Outpatient stress test - will arrange Follow up as an outpatient:  See above  For questions or updates, please contact [facility] Please consult [email or web address] for contact info under     Signed, Tonny Bollman, MD  03/12/2018 10:53 AM

## 2018-12-24 ENCOUNTER — Other Ambulatory Visit: Payer: Self-pay

## 2018-12-24 DIAGNOSIS — Z20822 Contact with and (suspected) exposure to covid-19: Secondary | ICD-10-CM

## 2018-12-25 LAB — NOVEL CORONAVIRUS, NAA: SARS-CoV-2, NAA: NOT DETECTED

## 2019-06-06 ENCOUNTER — Other Ambulatory Visit: Payer: Self-pay

## 2019-06-06 ENCOUNTER — Encounter: Payer: Self-pay | Admitting: Orthopedic Surgery

## 2019-06-08 ENCOUNTER — Other Ambulatory Visit: Payer: Self-pay

## 2019-06-08 ENCOUNTER — Other Ambulatory Visit
Admission: RE | Admit: 2019-06-08 | Discharge: 2019-06-08 | Disposition: A | Discharge status: Home / Self Care | Payer: Medicare PPO | Source: Ambulatory Visit | Attending: Orthopedic Surgery | Admitting: Orthopedic Surgery

## 2019-06-08 DIAGNOSIS — Z01812 Encounter for preprocedural laboratory examination: Secondary | ICD-10-CM | POA: Insufficient documentation

## 2019-06-08 DIAGNOSIS — Z20822 Contact with and (suspected) exposure to covid-19: Secondary | ICD-10-CM | POA: Diagnosis not present

## 2019-06-08 LAB — SARS CORONAVIRUS 2 (TAT 6-24 HRS): SARS Coronavirus 2: NEGATIVE

## 2019-06-09 ENCOUNTER — Other Ambulatory Visit: Payer: Self-pay | Admitting: Orthopedic Surgery

## 2019-06-09 MED ORDER — ONDANSETRON HCL 4 MG/2ML IJ SOLN
INTRAMUSCULAR | Status: AC
  Filled 2019-06-09: qty 2

## 2019-06-09 MED ORDER — SUGAMMADEX SODIUM 200 MG/2ML IV SOLN
INTRAVENOUS | Status: AC
  Filled 2019-06-09: qty 2

## 2019-06-09 MED ORDER — KETOROLAC TROMETHAMINE 30 MG/ML IJ SOLN
INTRAMUSCULAR | Status: AC
  Filled 2019-06-09: qty 1

## 2019-06-12 ENCOUNTER — Other Ambulatory Visit: Payer: Self-pay

## 2019-06-12 ENCOUNTER — Encounter: Payer: Self-pay | Admitting: Orthopedic Surgery

## 2019-06-12 ENCOUNTER — Encounter
Admission: RE | Disposition: A | Discharge status: Home / Self Care | Payer: Self-pay | Source: Home / Self Care | Attending: Orthopedic Surgery

## 2019-06-12 ENCOUNTER — Ambulatory Visit: Payer: Medicare PPO | Admitting: Anesthesiology

## 2019-06-12 ENCOUNTER — Ambulatory Visit
Admission: RE | Admit: 2019-06-12 | Discharge: 2019-06-12 | Disposition: A | Discharge status: Home / Self Care | Payer: Medicare PPO | Attending: Orthopedic Surgery | Admitting: Orthopedic Surgery

## 2019-06-12 DIAGNOSIS — Z79899 Other long term (current) drug therapy: Secondary | ICD-10-CM | POA: Insufficient documentation

## 2019-06-12 DIAGNOSIS — E119 Type 2 diabetes mellitus without complications: Secondary | ICD-10-CM | POA: Insufficient documentation

## 2019-06-12 DIAGNOSIS — Z7984 Long term (current) use of oral hypoglycemic drugs: Secondary | ICD-10-CM | POA: Insufficient documentation

## 2019-06-12 DIAGNOSIS — K219 Gastro-esophageal reflux disease without esophagitis: Secondary | ICD-10-CM | POA: Diagnosis not present

## 2019-06-12 DIAGNOSIS — L923 Foreign body granuloma of the skin and subcutaneous tissue: Secondary | ICD-10-CM | POA: Diagnosis not present

## 2019-06-12 DIAGNOSIS — M71862 Other specified bursopathies, left knee: Secondary | ICD-10-CM | POA: Insufficient documentation

## 2019-06-12 DIAGNOSIS — I10 Essential (primary) hypertension: Secondary | ICD-10-CM | POA: Diagnosis not present

## 2019-06-12 DIAGNOSIS — M25562 Pain in left knee: Secondary | ICD-10-CM | POA: Diagnosis present

## 2019-06-12 HISTORY — PX: KNEE BURSECTOMY: SHX5882

## 2019-06-12 HISTORY — DX: Type 2 diabetes mellitus without complications: E11.9

## 2019-06-12 LAB — GLUCOSE, CAPILLARY
Glucose-Capillary: 123 mg/dL — ABNORMAL HIGH (ref 70–99)
Glucose-Capillary: 119 mg/dL — ABNORMAL HIGH (ref 70–99)

## 2019-06-12 SURGERY — BURSECTOMY, KNEE
Anesthesia: General | Site: Knee | Laterality: Left

## 2019-06-12 MED ORDER — ONDANSETRON HCL 4 MG/2ML IJ SOLN
INTRAMUSCULAR | Status: DC | PRN
  Administered 2019-06-12: 4 mg via INTRAVENOUS

## 2019-06-12 MED ORDER — LACTATED RINGERS IV SOLN: 10.0000 mL/h | INTRAVENOUS | Status: DC

## 2019-06-12 MED ORDER — CHLORHEXIDINE GLUCONATE 4 % EX LIQD
60.0000 mL | Freq: Once | CUTANEOUS | Status: AC
  Administered 2019-06-12: 4 via TOPICAL

## 2019-06-12 MED ORDER — PROPOFOL 10 MG/ML IV BOLUS
INTRAVENOUS | Status: DC | PRN
  Administered 2019-06-12: 140 mg via INTRAVENOUS

## 2019-06-12 MED ORDER — CEFAZOLIN SODIUM-DEXTROSE 2-4 GM/100ML-% IV SOLN
2.0000 g | INTRAVENOUS | Status: AC
  Administered 2019-06-12: 2 g via INTRAVENOUS

## 2019-06-12 MED ORDER — OXYCODONE HCL 5 MG PO TABS
5.0000 mg | ORAL_TABLET | Freq: Once | ORAL | Status: AC | PRN
  Administered 2019-06-12: 14:00:00 5 mg via ORAL

## 2019-06-12 MED ORDER — FENTANYL CITRATE (PF) 100 MCG/2ML IJ SOLN: 25.0000 ug | INTRAMUSCULAR | Status: DC | PRN

## 2019-06-12 MED ORDER — EPHEDRINE SULFATE 50 MG/ML IJ SOLN
INTRAMUSCULAR | Status: DC | PRN
  Administered 2019-06-12 (×4): 5 mg via INTRAVENOUS

## 2019-06-12 MED ORDER — ACETAMINOPHEN 325 MG PO TABS: 325.0000 mg | ORAL_TABLET | ORAL | Status: DC | PRN

## 2019-06-12 MED ORDER — OXYCODONE HCL 5 MG/5ML PO SOLN: 5.0000 mg | Freq: Once | ORAL | Status: AC | PRN

## 2019-06-12 MED ORDER — ACETAMINOPHEN 160 MG/5ML PO SOLN: 325.0000 mg | ORAL | Status: DC | PRN

## 2019-06-12 MED ORDER — HYDROCODONE-ACETAMINOPHEN 5-325 MG PO TABS: 1.0000 | ORAL_TABLET | Freq: Four times a day (QID) | ORAL | 0 refills | Status: AC | PRN

## 2019-06-12 MED ORDER — FENTANYL CITRATE (PF) 100 MCG/2ML IJ SOLN
INTRAMUSCULAR | Status: DC | PRN
  Administered 2019-06-12: 50 ug via INTRAVENOUS

## 2019-06-12 MED ORDER — MIDAZOLAM HCL 5 MG/5ML IJ SOLN
INTRAMUSCULAR | Status: DC | PRN
  Administered 2019-06-12: 2 mg via INTRAVENOUS

## 2019-06-12 MED ORDER — DEXAMETHASONE SODIUM PHOSPHATE 4 MG/ML IJ SOLN
INTRAMUSCULAR | Status: DC | PRN
  Administered 2019-06-12: 4 mg via INTRAVENOUS

## 2019-06-12 MED ORDER — LIDOCAINE HCL (CARDIAC) PF 100 MG/5ML IV SOSY
PREFILLED_SYRINGE | INTRAVENOUS | Status: DC | PRN
  Administered 2019-06-12: 40 mg via INTRATRACHEAL

## 2019-06-12 MED ORDER — BUPIVACAINE HCL (PF) 0.5 % IJ SOLN
INTRAMUSCULAR | Status: DC | PRN
  Administered 2019-06-12: 10 mL

## 2019-06-12 MED ORDER — LACTATED RINGERS IV SOLN: INTRAVENOUS | Status: DC

## 2019-06-12 MED ORDER — ONDANSETRON HCL 4 MG/2ML IJ SOLN: 4.0000 mg | Freq: Once | INTRAMUSCULAR | Status: DC | PRN

## 2019-06-12 MED ORDER — BUPIVACAINE-EPINEPHRINE 0.5% -1:200000 IJ SOLN: INTRAMUSCULAR | Status: DC | PRN

## 2019-06-12 MED ORDER — GLYCOPYRROLATE 0.2 MG/ML IJ SOLN
INTRAMUSCULAR | Status: DC | PRN
  Administered 2019-06-12: .1 mg via INTRAVENOUS

## 2019-06-12 SURGICAL SUPPLY — 33 items
BLADE SURG 15 STRL LF DISP TIS (BLADE) ×1 IMPLANT
BLADE SURG 15 STRL SS (BLADE) ×2
BLADE SURG SZ11 CARB STEEL (BLADE) IMPLANT
BNDG ESMARK 4X12 TAN STRL LF (GAUZE/BANDAGES/DRESSINGS) ×3 IMPLANT
CHLORAPREP W/TINT 26 (MISCELLANEOUS) ×3 IMPLANT
COOLER POLAR GLACIER W/PUMP (MISCELLANEOUS) ×3 IMPLANT
COVER LIGHT HANDLE UNIVERSAL (MISCELLANEOUS) ×6 IMPLANT
COVER WAND RF STERILE (DRAPES) ×3 IMPLANT
CUFF TOURN SGL QUICK 30 (TOURNIQUET CUFF) ×2
CUFF TRNQT CYL 30X4X21-28X (TOURNIQUET CUFF) ×1 IMPLANT
DRAPE IMP U-DRAPE 54X76 (DRAPES) ×3 IMPLANT
GAUZE SPONGE 4X4 12PLY STRL (GAUZE/BANDAGES/DRESSINGS) ×3 IMPLANT
GLOVE INDICATOR 8.0 STRL GRN (GLOVE) ×3 IMPLANT
GLOVE SURG ORTHO 8.0 STRL STRW (GLOVE) ×3 IMPLANT
GOWN STRL REUS W/ TWL LRG LVL3 (GOWN DISPOSABLE) ×1 IMPLANT
GOWN STRL REUS W/ TWL XL LVL3 (GOWN DISPOSABLE) ×1 IMPLANT
GOWN STRL REUS W/TWL LRG LVL3 (GOWN DISPOSABLE) ×2
GOWN STRL REUS W/TWL XL LVL3 (GOWN DISPOSABLE) ×2
KIT TURNOVER KIT A (KITS) ×3 IMPLANT
MANIFOLD NEPTUNE II (INSTRUMENTS) ×3 IMPLANT
MAT ABSORB  FLUID 56X50 GRAY (MISCELLANEOUS)
MAT ABSORB FLUID 56X50 GRAY (MISCELLANEOUS) IMPLANT
NEEDLE SPNL 20GX3.5 QUINCKE YW (NEEDLE) IMPLANT
NS IRRIG 500ML POUR BTL (IV SOLUTION) ×3 IMPLANT
PACK ARTHROSCOPY KNEE (MISCELLANEOUS) ×3 IMPLANT
PAD ABD DERMACEA PRESS 5X9 (GAUZE/BANDAGES/DRESSINGS) ×3 IMPLANT
PAD WRAPON POLAR KNEE (MISCELLANEOUS) ×1 IMPLANT
SUCTION FRAZIER HANDLE 10FR (MISCELLANEOUS) ×2
SUCTION TUBE FRAZIER 10FR DISP (MISCELLANEOUS) ×1 IMPLANT
SUT ETHILON 3-0 (SUTURE) ×3 IMPLANT
SUT VIC AB 2-0 SH 27 (SUTURE) ×2
SUT VIC AB 2-0 SH 27XBRD (SUTURE) ×1 IMPLANT
WRAPON POLAR PAD KNEE (MISCELLANEOUS) ×3

## 2019-06-12 NOTE — Transfer of Care (Signed)
Immediate Anesthesia Transfer of Care Note  Patient: Tyrone Jarvis  Procedure(s) Performed: LEFT KNEE BURSECTOMY excision of meniscus or capsularis per Tiffany (Left Knee)  Patient Location: PACU  Anesthesia Type: General  Level of Consciousness: awake, alert  and patient cooperative  Airway and Oxygen Therapy: Patient Spontanous Breathing and Patient connected to supplemental oxygen  Post-op Assessment: Post-op Vital signs reviewed, Patient's Cardiovascular Status Stable, Respiratory Function Stable, Patent Airway and No signs of Nausea or vomiting  Post-op Vital Signs: Reviewed and stable  Complications: No apparent anesthesia complications

## 2019-06-12 NOTE — Anesthesia Preprocedure Evaluation (Signed)
Anesthesia Evaluation  Patient identified by MRN, date of birth, ID band Patient awake    Reviewed: Allergy & Precautions, H&P , NPO status , Patient's Chart, lab work & pertinent test results  Airway Mallampati: II  TM Distance: >3 FB Neck ROM: full    Dental no notable dental hx. (+) Partial Upper   Pulmonary    Pulmonary exam normal breath sounds clear to auscultation       Cardiovascular hypertension, Normal cardiovascular exam Rhythm:regular Rate:Normal     Neuro/Psych    GI/Hepatic GERD  ,  Endo/Other  diabetes, Well Controlled, Type 2  Renal/GU      Musculoskeletal   Abdominal   Peds  Hematology   Anesthesia Other Findings   Reproductive/Obstetrics                             Anesthesia Physical Anesthesia Plan  ASA: II  Anesthesia Plan: General   Post-op Pain Management:    Induction: Intravenous  PONV Risk Score and Plan: 2 and Treatment may vary due to age or medical condition, Propofol infusion and TIVA  Airway Management Planned: Natural Airway  Additional Equipment:   Intra-op Plan:   Post-operative Plan:   Informed Consent: I have reviewed the patients History and Physical, chart, labs and discussed the procedure including the risks, benefits and alternatives for the proposed anesthesia with the patient or authorized representative who has indicated his/her understanding and acceptance.     Dental Advisory Given  Plan Discussed with: CRNA  Anesthesia Plan Comments:         Anesthesia Quick Evaluation

## 2019-06-12 NOTE — H&P (Signed)
The patient has been re-examined, and the chart reviewed, and there have been no interval changes to the documented history and physical.  Plan a left knee open bursectomy today.  Anesthesia is not consulted regarding a peripheral nerve block for post-operative pain.  The risks, benefits, and alternatives have been discussed at length, and the patient is willing to proceed.

## 2019-06-12 NOTE — Discharge Instructions (Signed)
Post Op Home Instructions   1) Do not sit for longer than 1 hour at a time with your leg dangling down.  You should have your legs elevated (higher than your heart) in a recliner chair or couch.  2) You may be up walking around as tolerated but should take periodic breaks to elevate your legs.  Discontinue use of crutches when you feel you are able to walk without pain or a limp.  3) Do NOT fully bend the knee  4) You may remove the Ace wrap and dressings two days after surgery.  Place band aid over the incision.  5) You may shower after you remove the surgical dressing.  You do not need to cover the incision with plastic wrap.  The incision can get wet, but do not submerge under water.  After your sutures have been removed, you should wait 24 hours before submerging incision under water.  6) Pain medication can cause constipation.  You should increase your fluid intake, increase your intake of high fiber foods and/or take Metamucil as needed for constipation.  7) Continue your physical therapy exercises, as shown at the office, at least twice daily.  You should set up outpatient physical therapy and start within the first week after surgery.  8) Continue to use your Polar Pack continuously for 2-3 days after surgery.  After you remove the surgical dressing, it is a good idea to use your Polar Pack or ice pack for 30 minutes after doing your exercises to reduce swelling.  9) Do not be surprised if you have increased pain at night.  This usually means you have been a little too active during the day and need to reduce your activities.  10) If you develop lower extremity swelling that does not improve after a night of elevation, please call the office.  This could be an early sign of a blood clot.  Please call with any questions at [phone]    General Anesthesia, Adult, Care After This sheet gives you information about how to care for yourself after your procedure. Your health care  provider may also give you more specific instructions. If you have problems or questions, contact your health care provider. What can I expect after the procedure? After the procedure, the following side effects are common:  Pain or discomfort at the IV site.  Nausea.  Vomiting.  Sore throat.  Trouble concentrating.  Feeling cold or chills.  Weak or tired.  Sleepiness and fatigue.  Soreness and body aches. These side effects can affect parts of the body that were not involved in surgery. Follow these instructions at home:  For at least 24 hours after the procedure:  Have a responsible adult stay with you. It is important to have someone help care for you until you are awake and alert.  Rest as needed.  Do not: ? Participate in activities in which you could fall or become injured. ? Drive. ? Use heavy machinery. ? Drink alcohol. ? Take sleeping pills or medicines that cause drowsiness. ? Make important decisions or sign legal documents. ? Take care of children on your own. Eating and drinking  Follow any instructions from your health care provider about eating or drinking restrictions.  When you feel hungry, start by eating small amounts of foods that are soft and easy to digest (bland), such as toast. Gradually return to your regular diet.  Drink enough fluid to keep your urine pale yellow.  If you vomit, rehydrate by  drinking water, juice, or clear broth. General instructions  If you have sleep apnea, surgery and certain medicines can increase your risk for breathing problems. Follow instructions from your health care provider about wearing your sleep device: ? Anytime you are sleeping, including during daytime naps. ? While taking prescription pain medicines, sleeping medicines, or medicines that make you drowsy.  Return to your normal activities as told by your health care provider. Ask your health care provider what activities are safe for you.  Take  over-the-counter and prescription medicines only as told by your health care provider.  If you smoke, do not smoke without supervision.  Keep all follow-up visits as told by your health care provider. This is important. Contact a health care provider if:  You have nausea or vomiting that does not get better with medicine.  You cannot eat or drink without vomiting.  You have pain that does not get better with medicine.  You are unable to pass urine.  You develop a skin rash.  You have a fever.  You have redness around your IV site that gets worse. Get help right away if:  You have difficulty breathing.  You have chest pain.  You have blood in your urine or stool, or you vomit blood. Summary  After the procedure, it is common to have a sore throat or nausea. It is also common to feel tired.  Have a responsible adult stay with you for the first 24 hours after general anesthesia. It is important to have someone help care for you until you are awake and alert.  When you feel hungry, start by eating small amounts of foods that are soft and easy to digest (bland), such as toast. Gradually return to your regular diet.  Drink enough fluid to keep your urine pale yellow.  Return to your normal activities as told by your health care provider. Ask your health care provider what activities are safe for you. This information is not intended to replace advice given to you by your health care provider. Make sure you discuss any questions you have with your health care provider. Document Revised: 04/30/2017 Document Reviewed: 12/11/2016 Elsevier Patient Education  [place].

## 2019-06-12 NOTE — Op Note (Signed)
06/12/2019  1:21 PM  PATIENT:  Tyrone Jarvis  69 y.o. male  PRE-OPERATIVE DIAGNOSIS:  Painful bursa, left knee  POST-OPERATIVE DIAGNOSIS:  Painful bursa, Left Knee, foreign body reaction  PROCEDURE:  Procedure(s) with comments: LEFT KNEE BURSECTOMY open excision and removal of foreign body  SURGEON:  Surgeon(s) and Role:    Lyndle Herrlich, MD - Primary  PHYSICIAN ASSISTANT:   ASSISTANTS: none   ANESTHESIA:   local and general  EBL:  Total I/O In: 750 [I.V.:750] Out: 5 [Blood:5]  BLOOD ADMINISTERED:none  DRAINS: none   SPECIMEN:  Source of Specimen:  left anterior knee  COUNTS:  YES  TOURNIQUET:   Total Tourniquet Time Documented: Thigh (Left) - 9 minutes Total: Thigh (Left) - 9 minutes  DESCRIPTION OF THE PROCEDURE IN DETAIL:  After informed consent was obtained the patient was taken to the operating room and placed in the supine position. General anesthesia was induced. The left lower extremity was prepped and draped in standard fashion. The extremity was exsanguinated after a surgical timeout and the tourniquet inflated to 250 mmHg.  The prior horizontal incision was utilized along the inferior border of the patella. The bursa was isolated using sharp dissection. At the base of the bursa was non-absorbable suture material. This was also removed sharply and the bursa and cystic material removed as a unit and sent as a specimen. The bony prominence was made smooth with a bone rasp. Hemostasis was achieved with electrocautery. The wound was irrigated and closed with 2-0 Vicryl and 3-0 Nylon for the skin. A sterile compressive dressing was applied followed by a Polar Care cryocuff. He tolerated the procedure well and there were no apparent complications.  Cassell Smiles, MD

## 2019-06-12 NOTE — Anesthesia Procedure Notes (Signed)
Procedure Name: LMA Insertion Date/Time: 06/12/2019 12:38 PM Performed by: Jimmy Picket, CRNA Pre-anesthesia Checklist: Patient identified, Emergency Drugs available, Suction available, Timeout performed and Patient being monitored Patient Re-evaluated:Patient Re-evaluated prior to induction Oxygen Delivery Method: Circle system utilized Preoxygenation: Pre-oxygenation with 100% oxygen Induction Type: IV induction LMA: LMA inserted LMA Size: 4.0 Number of attempts: 1 Placement Confirmation: positive ETCO2 and breath sounds checked- equal and bilateral Tube secured with: Tape

## 2019-06-12 NOTE — Anesthesia Postprocedure Evaluation (Signed)
Anesthesia Post Note  Patient: Tyrone Jarvis  Procedure(s) Performed: LEFT KNEE BURSECTOMY excision of meniscus or capsularis per Tiffany (Left Knee)     Patient location during evaluation: PACU Anesthesia Type: General Level of consciousness: awake and alert and oriented Pain management: satisfactory to patient Vital Signs Assessment: post-procedure vital signs reviewed and stable Respiratory status: spontaneous breathing, nonlabored ventilation and respiratory function stable Cardiovascular status: blood pressure returned to baseline and stable Postop Assessment: Adequate PO intake and No signs of nausea or vomiting Anesthetic complications: no    Cherly [place]

## 2019-06-13 ENCOUNTER — Encounter: Payer: Self-pay | Admitting: *Deleted

## 2019-06-14 LAB — SURGICAL PATHOLOGY

## 2020-05-17 IMAGING — DX DG CHEST 1V PORT
2 series · 2 of 2 positions shown · non-contrast
Comparison: 11/01/2010

CLINICAL DATA: Excruciating chest pain since 9898 hours tonight.

EXAM:
PORTABLE CHEST 1 VIEW

[chest ap (1 of 2)]
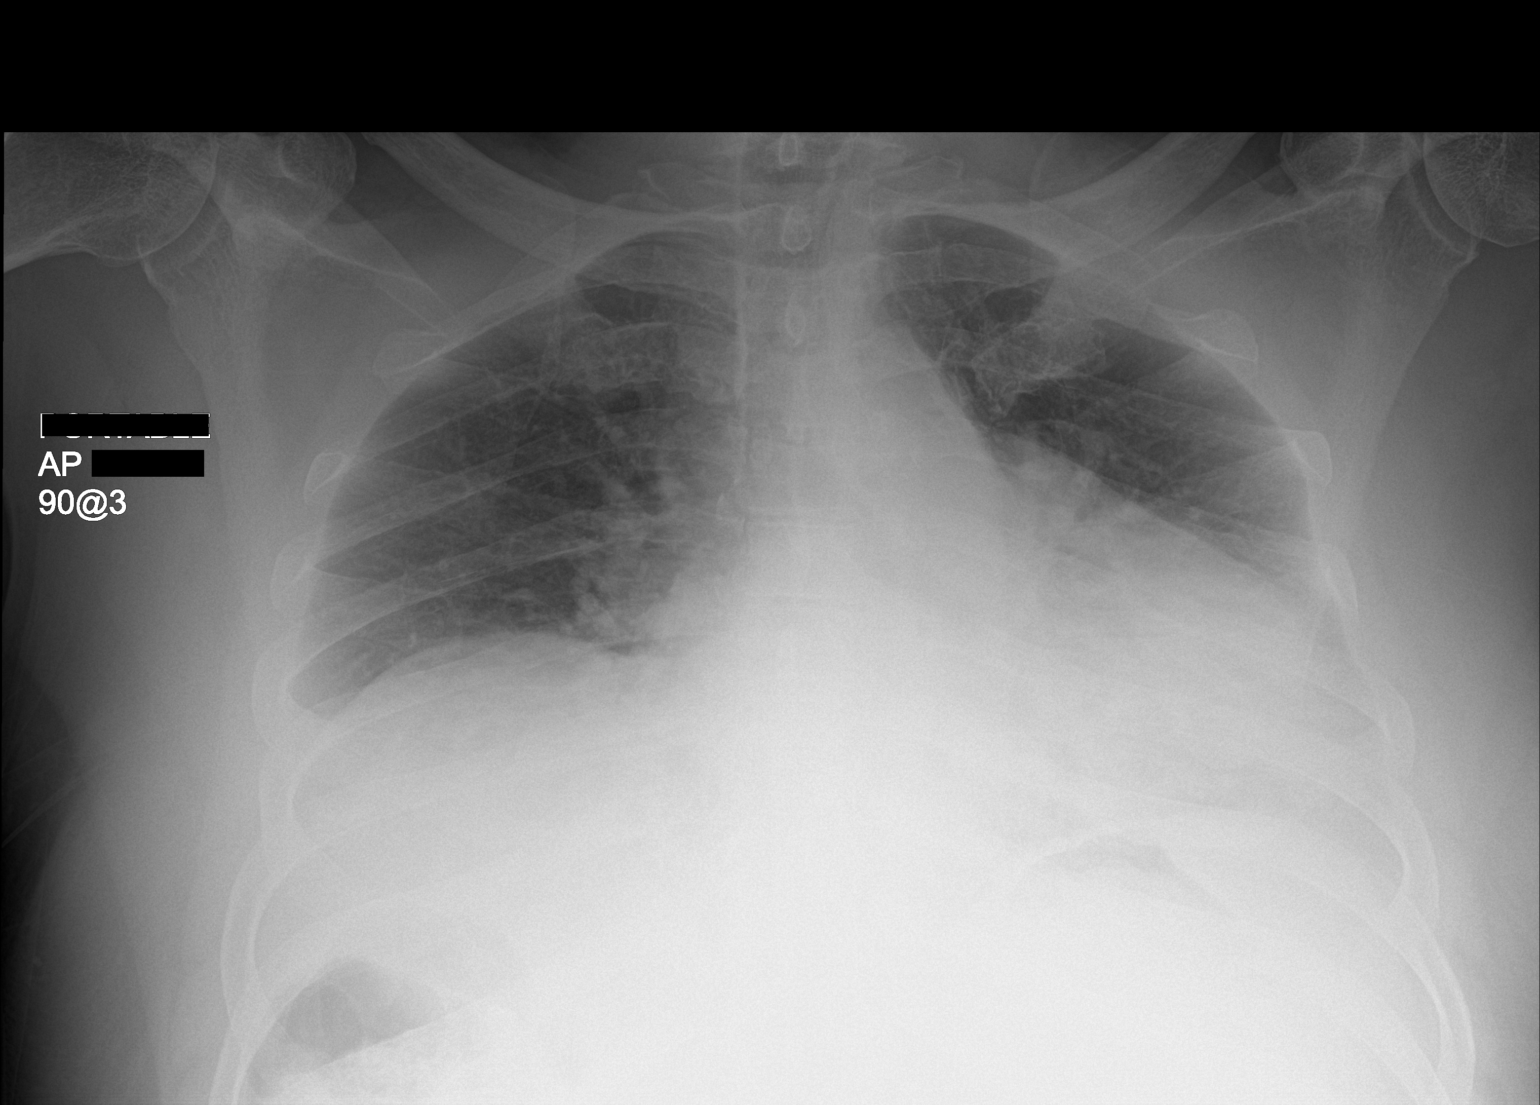

[chest ap (2 of 2)]
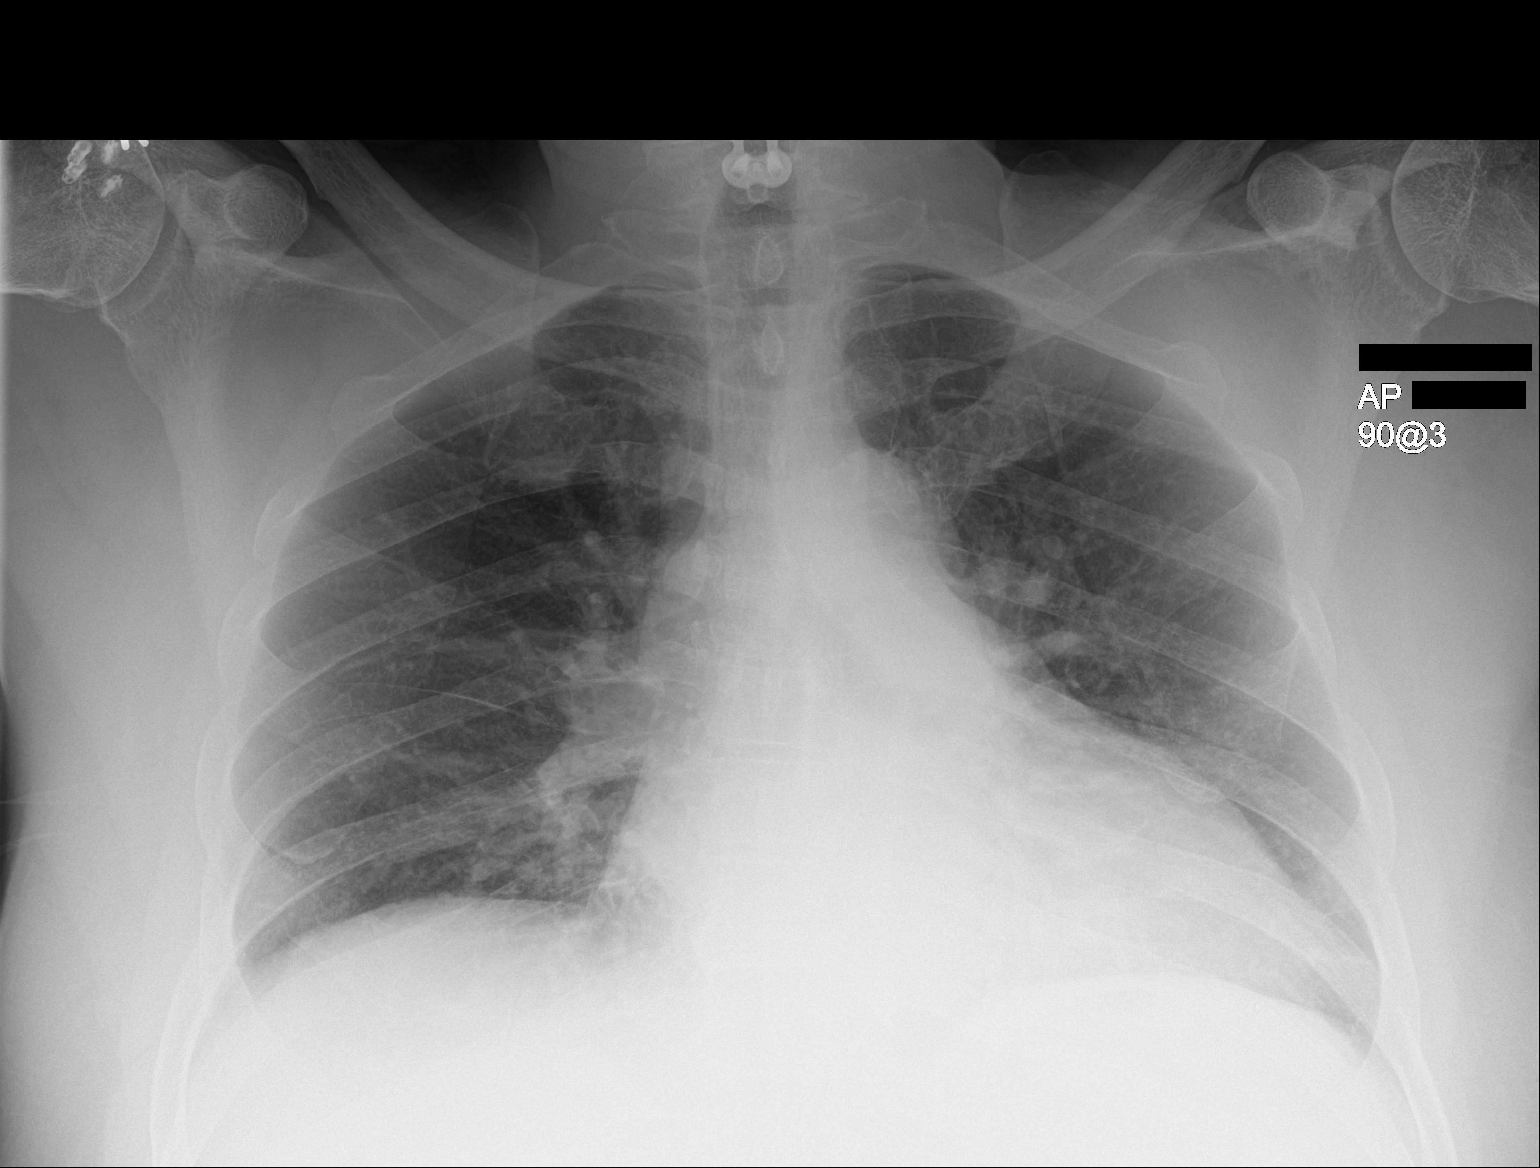

[2 of 2 positions shown; findings below may reference images not displayed]

FINDINGS: Low lung volumes with borderline cardiomegaly. Nonaneurysmal
thoracic aorta. There is bibasilar atelectasis. Blunting of the
costophrenic angles may reflect small pleural effusions. There is
mild central vascular congestion. ACDF hardware noted of the
included lower cervical spine. Surgical anchors project over the
right humeral head. No acute osseous abnormality. No pneumothorax.
IMPRESSION: Low lung volumes with bibasilar atelectasis and mild central
vascular congestion. Probable small pleural effusions. Stable
borderline cardiomegaly.

## 2020-12-02 ENCOUNTER — Ambulatory Visit
Admission: EM | Admit: 2020-12-02 | Discharge: 2020-12-02 | Disposition: A | Discharge status: Home / Self Care | Payer: Medicare PPO | Attending: Family Medicine | Admitting: Family Medicine

## 2020-12-02 ENCOUNTER — Other Ambulatory Visit: Payer: Self-pay

## 2020-12-02 DIAGNOSIS — Z20822 Contact with and (suspected) exposure to covid-19: Secondary | ICD-10-CM | POA: Diagnosis not present

## 2020-12-02 LAB — SARS CORONAVIRUS 2 (TAT 6-24 HRS): SARS Coronavirus 2: NEGATIVE

## 2020-12-02 NOTE — ED Triage Notes (Signed)
Pt states he was exposed to covid at church, has no symptoms just here for screening.

## 2022-06-10 ENCOUNTER — Ambulatory Visit: Payer: Medicare PPO | Admitting: Urology

## 2022-06-10 ENCOUNTER — Encounter: Payer: Self-pay | Admitting: Urology

## 2022-06-10 VITALS — BP 152/79 | HR 67 | Ht 76.0 in | Wt 213.0 lb

## 2022-06-10 DIAGNOSIS — R102 Pelvic and perineal pain: Secondary | ICD-10-CM

## 2022-06-10 DIAGNOSIS — N5312 Painful ejaculation: Secondary | ICD-10-CM | POA: Diagnosis not present

## 2022-06-10 MED ORDER — CELECOXIB 200 MG PO CAPS: 200.0000 mg | ORAL_CAPSULE | Freq: Two times a day (BID) | ORAL | 0 refills | Status: DC

## 2022-06-10 MED ORDER — SULFAMETHOXAZOLE-TRIMETHOPRIM 800-160 MG PO TABS: 1.0000 | ORAL_TABLET | Freq: Two times a day (BID) | ORAL | 0 refills | Status: AC

## 2022-06-10 NOTE — Patient Instructions (Signed)

## 2022-06-10 NOTE — Progress Notes (Signed)
   06/10/22 8:26 AM   Tyrone Jarvis [birth year] [id number]  CC: Painful ejaculation, PSA screening  HPI: 72 year old male who reports 4 to 6 months of pain with ejaculations.  He denies any significant urinary symptoms or dysuria, has some occasional urgency.  He reportedly was treated with Levaquin at some point, but he does not remember taking that medication, or if it improved his painful ejaculations.  He takes Cialis with good results for ED.  PSA was checked by PCP and was normal at 0.3 in January 2024.  No cross-sectional pelvic imaging to review.  He was unable to void for urinalysis today.  He denies any gross hematuria.   PMH: Past Medical History:  Diagnosis Date   Diabetes mellitus, type 2 ([place])    Hyperlipidemia    Hypertension     Surgical History: Past Surgical History:  Procedure Laterality Date   KNEE BURSECTOMY Left 06/12/2019   Procedure: LEFT KNEE BURSECTOMY excision of meniscus or capsularis per Tiffany;  Surgeon: Lovell Sheehan, MD;  Location: [facility];  Service: Orthopedics;  Laterality: Left;  Diabetic - oral meds   KNEE SURGERY Bilateral    ROTATOR CUFF REPAIR Right      Family History: Family History  Problem Relation Age of Onset   Diabetes Mellitus II Mother    CAD Neg Hx     Social History:  reports that he has never smoked. He has never been exposed to tobacco smoke. He has never used smokeless tobacco. He reports that he does not drink alcohol. No history on file for drug use.  Physical Exam: BP (!) 152/79   Pulse 67   Ht 6\' 4"  (1.93 m)   Wt 213 lb (96.6 kg)   BMI 25.93 kg/m    Constitutional:  Alert and oriented, No acute distress. Cardiovascular: No clubbing, cyanosis, or edema. Respiratory: Normal respiratory effort, no increased work of breathing. GI: Abdomen is soft, nontender, nondistended, no abdominal masses DRE: 30 g, smooth, no nodules or masses  Laboratory Data: See HPI  Pertinent Imaging: None to  review  Assessment & Plan:   72 year old male with 6 months of painful ejaculations of unclear etiology.  DRE normal, PSA normal at 0.3, patient was unable to void for urinalysis today.  We discussed possible etiologies including prostatitis or idiopathic.  I recommended a trial of Bactrim DS twice daily x 3 weeks as well as 10 days of Celebrex, with follow-up in 1 month for symptom check.  If no improvement could consider trial of Flomax.  Reassurance provided regarding normal DRE and PSA.  Bactrim DS twice daily and Celebrex for possible prostatitis RTC 1 to 2 months symptom check, consider Flomax if no improvement at that time  Nickolas Madrid, MD 06/10/2022  [facility] [address] [place], [place] [address] [phone]

## 2022-06-26 ENCOUNTER — Telehealth: Payer: Self-pay | Admitting: Urology

## 2022-06-26 DIAGNOSIS — R21 Rash and other nonspecific skin eruption: Secondary | ICD-10-CM

## 2022-06-26 MED ORDER — NYSTATIN-TRIAMCINOLONE 100000-0.1 UNIT/GM-% EX OINT: 1.0000 | TOPICAL_OINTMENT | Freq: Two times a day (BID) | CUTANEOUS | 0 refills | Status: AC

## 2022-06-26 NOTE — Telephone Encounter (Signed)
Mr. Ungerman stopped by and stated that the medication Dr. Diamantina Providence has him on is causing a spot on his Penis and it is irritated and painful. He states this was not there before the meds. He has taking all of the Bactrim and has 10 days left of the Celebrex wants to know what he should do. Please give him a call.

## 2022-06-26 NOTE — Telephone Encounter (Signed)
Called pt he states that 1-2 days after starting bactrim and Celebrex he began noticed a pink spot near the head of his penis. He believes this is a side effect of the mediations. He has completed Bactrim but still have several days of Celebrex remaining. Pt advised to discontinue Celebrex. Patient has been applying alcohol and neosporin the area in order to prevent infection. Patient advised not to use anymore alcohol on the area. He states that he area is painful and irritated. Please advise.

## 2022-06-26 NOTE — Telephone Encounter (Signed)
Called pt informed him of RX sent in per Dr. Diamantina Providence. Pt voiced understanding. Pt advised to call back on Monday if no relief.

## 2022-07-20 ENCOUNTER — Other Ambulatory Visit: Payer: Self-pay | Admitting: Urology

## 2022-07-20 DIAGNOSIS — R21 Rash and other nonspecific skin eruption: Secondary | ICD-10-CM

## 2022-07-29 ENCOUNTER — Ambulatory Visit: Payer: Medicare PPO | Admitting: Urology

## 2022-07-29 ENCOUNTER — Encounter: Payer: Self-pay | Admitting: Urology

## 2022-07-29 VITALS — BP 163/76 | HR 67 | Ht 76.0 in | Wt 217.4 lb

## 2022-07-29 DIAGNOSIS — R102 Pelvic and perineal pain: Secondary | ICD-10-CM | POA: Diagnosis not present

## 2022-07-29 DIAGNOSIS — Z125 Encounter for screening for malignant neoplasm of prostate: Secondary | ICD-10-CM | POA: Diagnosis not present

## 2022-07-29 MED ORDER — DOXYCYCLINE HYCLATE 100 MG PO CAPS: 100.0000 mg | ORAL_CAPSULE | Freq: Two times a day (BID) | ORAL | 0 refills | Status: DC

## 2022-07-29 NOTE — Progress Notes (Signed)
   07/29/2022 8:25 AM   Tyrone Jarvis 10-16-1950 [id number]  Reason for visit: Follow up painful ejaculation, penile lesion, PSA screening  HPI: I saw Tyrone Jarvis back today for the above issues.  I originally saw him in January 2024 when he was reporting 4 to 6 months of pain with ejaculations of unclear etiology.  Only urinary symptoms were occasional urgency.  He was unable to void for urinalysis at that visit, DRE was benign, PSA was normal at 0.3, and we opted for a trial of Bactrim and Celebrex.  He only took the Bactrim for about 4 days, and stopped taking it because it made him feel  "not right ." He reportedly developed a penile rash after being on the Bactrim felt to be secondary to balanitis and was treated with Mycolog cream.  Overall he does think the antibiotic helped, and he reports he is 65% better than before.  He continues to deny any urinary symptoms or gross hematuria.  On exam, uncircumcised phallus with no lesions, testicles 20 cc and descended bilaterally without masses.  DRE was benign at our last visit   Reassurance was provided regarding his normal PSA of 0.3, and low likelihood of prostate cancer as etiology of his pain with ejaculations.  AUA guidelines were reviewed that do not recommend routine PSA screening in men over age 52.  The fact that he improved after just a few days of Bactrim I think this is more likely related to prostatitis, and I recommended 2 weeks of doxycycline.  We discussed the importance of completing the entire course of antibiotics- he would like to follow-up as needed and will contact us if he has persistent pain with ejaculations.    Billey Co, [facility] [address] [place], [place] [address] [phone]

## 2022-07-29 NOTE — Patient Instructions (Signed)

## 2022-08-18 ENCOUNTER — Encounter: Payer: Self-pay | Admitting: Urology

## 2022-08-18 ENCOUNTER — Ambulatory Visit (INDEPENDENT_AMBULATORY_CARE_PROVIDER_SITE_OTHER): Payer: Medicare PPO | Admitting: Urology

## 2022-08-18 VITALS — BP 146/76 | HR 65 | Ht 76.0 in | Wt 219.8 lb

## 2022-08-18 DIAGNOSIS — N5312 Painful ejaculation: Secondary | ICD-10-CM | POA: Diagnosis not present

## 2022-08-18 DIAGNOSIS — R102 Pelvic and perineal pain: Secondary | ICD-10-CM

## 2022-08-18 NOTE — Patient Instructions (Signed)
Pelvic Floor Dysfunction, Male     Pelvic floor dysfunction (PFD) is a condition that results when the group of muscles and connective tissues that support the organs in the pelvis (pelvic floor muscles) do not work well. These muscles and their connections form a sling that supports the colon and bladder. In men, these muscles also support the prostate gland. PFD causes pelvic floor muscles to be too weak, too tight, or both. In PFD, muscle movements are not coordinated. This may cause bowel or bladder problems. It may also cause pain. What are the causes? This condition may be caused by an injury to the pelvic area or by a weakening of pelvic muscles. In many cases, the exact cause is not known. What increases the risk? The following factors may make you more likely to develop PFD: Having chronic bladder tissue inflammation (interstitial cystitis). Being an older person. Being overweight. History of radiation treatment for cancer in the pelvic region. Previous pelvic surgery, such as removal of the prostate gland (prostatectomy). What are the signs or symptoms? Symptoms of this condition vary and may include: Bladder symptoms, such as: Trouble starting urination and emptying the bladder. Frequent urinary tract infections. Leaking urine when coughing, laughing, or exercising (stress incontinence). Having to pass urine urgently or frequently. Pain when passing urine. Bowel symptoms, such as: Constipation. Urgent or frequent bowel movements. Incomplete bowel movements. Painful bowel movements. Leaking stool or gas. Unexplained genital or rectal pain. Genital or rectal muscle spasms. Low back pain. Sexual dysfunction, such as erectile dysfunction, premature ejaculation, or pain during or after sexual activity. How is this diagnosed? This condition is diagnosed based on: Your symptoms and medical history. A physical exam. During the exam, your health care provider may check your  pelvic muscles for tightness, spasm, pain, or weakness. This may include a rectal exam. In some cases, you may have diagnostic tests, such as: Electrical muscle function tests. Urine flow testing. X-ray tests of bowel function. Ultrasound of the pelvic organs. How is this treated? Treatment for this condition depends on your symptoms. Treatment options include: Physical therapy. This may include Kegel exercises to help relax or strengthen the pelvic floor muscles. Biofeedback. This type of therapy provides feedback on how tight your pelvic floor muscles are so that you can learn to control them. Massage therapy. A treatment that involves electrical stimulation of the pelvic floor muscles to help control pain (transcutaneous electrical nerve stimulation, or TENS). Sound wave therapy (ultrasound) to reduce muscle spasms. Medicines, such as: Muscle relaxants. Bladder control medicines. Surgery to reconstruct or support pelvic floor muscles may be an option if other treatments do not help. Follow these instructions at home: Activity Do your usual activities as told by your health care provider. Ask your health care provider if you should modify any activities. Do pelvic floor strengthening or relaxing exercises at home as told by your physical therapist. Lifestyle Maintain a healthy weight. Eat foods that are high in fiber, such as beans, whole grains, and fresh fruits and vegetables. Limit foods that are high in fat and processed sugars, such as fried or sweet foods. Manage stress with relaxation techniques such as yoga or meditation. General instructions If you have problems with leakage: Use absorbable pads or wear padded underwear. Wash your genital and anal area frequently with mild soap. Keep your genital and anal area as clean and dry as possible. Ask your health care provider if you should try a barrier cream to prevent skin irritation. Take warm baths   to relieve pelvic muscle  tension or spasms. Take over-the-counter and prescription medicines only as told by your health care provider. Keep all follow-up visits. How is this prevented? The cause of PFD is not always known, but there are a few things you can do to reduce the risk of developing this condition, including: Staying at a healthy weight. Getting regular exercise. Managing stress. Contact a health care provider if: Your symptoms are not improving with home care. You have signs or symptoms of PFD that get worse. You develop new signs or symptoms. You have signs of a urinary tract infection, such as: Fever. Chills. Increased urinary frequency. A burning feeling when urinating. You have not had a bowel movement in 3 days (constipation). Summary Pelvic floor dysfunction results when the muscles and connective tissues in your pelvic floor do not work well. These muscles and their connections form a sling that supports your colon and bladder. In men, these muscles also support the prostate gland. PFD may be caused by an injury to the pelvic area or by a weakening of pelvic muscles. PFD causes pelvic floor muscles to be too weak, too tight, or a combination of both. Symptoms may vary from person to person. In most cases, PFD can be treated with physical therapies and medicines. Surgery may be an option if other treatments do not help. This information is not intended to replace advice given to you by your health care provider. Make sure you discuss any questions you have with your health care provider. Document Revised: 09/04/2020 Document Reviewed: 09/04/2020 Elsevier Patient Education  2023 Elsevier Inc.    

## 2022-08-18 NOTE — Progress Notes (Signed)
   08/18/2022 10:51 AM   Tyrone Jarvis 09/02/1950 [id number]  Reason for visit: Follow up painful ejaculation, PSA screening  HPI: I saw [name] back today for the above issues.  I originally saw him in January 2024 when he was reporting 4 to 6 months of pain with ejaculations of unclear etiology.  Only urinary symptoms were occasional urgency.  He was unable to void for urinalysis at that visit, DRE was benign, PSA was normal at 0.3, and we opted for a trial of Bactrim and Celebrex.  He only took the Bactrim for about 4 days, and stopped taking it because it made him feel  "not right ." He reportedly developed a penile rash after being on the Bactrim felt to be secondary to balanitis and was treated with Mycolog cream.  When I saw him on 07/29/2022 he still had some mild discomfort and we opted for a trial of 2 weeks doxycycline.  He continues to improve.  He has some very mild discomfort with ejaculations but denies any urinary symptoms.  He would like to follow-up as needed at this point.  Return precautions were discussed.  Reassurance provided regarding normal and stable PSA over the last few years, could consider trial of Celebrex in the future if worsening discomfort with ejaculations.  Pelvic floor dysfunction/pelvic floor stretching exercises also discussed and reviewed    Sondra Come, MD  [facility] [address], [address] [place], [place] [address] [phone]

## 2023-03-24 ENCOUNTER — Other Ambulatory Visit: Payer: Self-pay | Admitting: *Deleted

## 2023-03-24 ENCOUNTER — Other Ambulatory Visit
Admission: RE | Admit: 2023-03-24 | Discharge: 2023-03-24 | Disposition: A | Discharge status: Home / Self Care | Payer: Medicare PPO | Attending: Urology | Admitting: Urology

## 2023-03-24 ENCOUNTER — Ambulatory Visit: Payer: Medicare PPO | Admitting: Urology

## 2023-03-24 ENCOUNTER — Encounter: Payer: Self-pay | Admitting: Urology

## 2023-03-24 VITALS — BP 151/79 | HR 70 | Ht 76.0 in | Wt 221.0 lb

## 2023-03-24 DIAGNOSIS — R102 Pelvic and perineal pain: Secondary | ICD-10-CM | POA: Diagnosis not present

## 2023-03-24 DIAGNOSIS — Z87438 Personal history of other diseases of male genital organs: Secondary | ICD-10-CM

## 2023-03-24 DIAGNOSIS — Z125 Encounter for screening for malignant neoplasm of prostate: Secondary | ICD-10-CM | POA: Diagnosis not present

## 2023-03-24 LAB — URINALYSIS, COMPLETE (UACMP) WITH MICROSCOPIC
Bacteria, UA: NONE SEEN
Bilirubin Urine: NEGATIVE
Glucose, UA: NEGATIVE mg/dL
Hgb urine dipstick: NEGATIVE
Ketones, ur: NEGATIVE mg/dL
Leukocytes,Ua: NEGATIVE
Nitrite: NEGATIVE
Protein, ur: NEGATIVE mg/dL
RBC / HPF: NONE SEEN RBC/hpf (ref 0–5)
Specific Gravity, Urine: 1.01 (ref 1.005–1.030)
WBC, UA: NONE SEEN WBC/hpf (ref 0–5)
pH: 6.5 (ref 5.0–8.0)

## 2023-03-24 LAB — BLADDER SCAN AMB NON-IMAGING

## 2023-03-24 MED ORDER — DOXYCYCLINE HYCLATE 100 MG PO CAPS: 100.0000 mg | ORAL_CAPSULE | Freq: Two times a day (BID) | ORAL | 0 refills | Status: AC

## 2023-03-24 MED ORDER — CELECOXIB 200 MG PO CAPS: 200.0000 mg | ORAL_CAPSULE | Freq: Two times a day (BID) | ORAL | 0 refills | Status: AC

## 2023-03-24 NOTE — Patient Instructions (Signed)

## 2023-03-24 NOTE — Progress Notes (Signed)
   03/24/2023 3:42 PM   JAVARRI DENSFORD [birth year] [id number]  Reason for visit: Pain with ejaculations, questions about PSA screening  HPI: 72 year old male who I previously saw in April 2024 for pain with ejaculations.  DRE was benign at that time, PSA was normal at 0.3, and he ultimately was treated with a course of doxycycline and Celebrex.  He reports this improved his symptoms.  He has had some pain with ejaculations recently, but things have improved slightly over the last few days.  He denies any urinary symptoms, gross hematuria, or dysuria.  Urinalysis today is benign.  We reviewed possible etiologies including prostatitis or pelvic floor dysfunction.  Reassurance provided regarding prior normal PSA values, he is interested in repeating a PSA after treatment of possible infection.  Trial of doxycycline 100 mg twice daily x 14 days and Celebrex RTC 8 weeks lab visit PSA, call with results  Sondra Come, MD  [facility] [address] [place], [place] [address] [phone]

## 2023-09-27 ENCOUNTER — Ambulatory Visit: Admitting: Anesthesiology

## 2023-09-27 ENCOUNTER — Ambulatory Visit
Admission: RE | Admit: 2023-09-27 | Discharge: 2023-09-27 | Disposition: A | Discharge status: Home / Self Care | Attending: Gastroenterology | Admitting: Gastroenterology

## 2023-09-27 ENCOUNTER — Encounter
Admission: RE | Disposition: A | Discharge status: Home / Self Care | Payer: Self-pay | Source: Home / Self Care | Attending: Gastroenterology

## 2023-09-27 DIAGNOSIS — Z1211 Encounter for screening for malignant neoplasm of colon: Secondary | ICD-10-CM | POA: Insufficient documentation

## 2023-09-27 DIAGNOSIS — Z860101 Personal history of adenomatous and serrated colon polyps: Secondary | ICD-10-CM | POA: Insufficient documentation

## 2023-09-27 DIAGNOSIS — K5730 Diverticulosis of large intestine without perforation or abscess without bleeding: Secondary | ICD-10-CM | POA: Insufficient documentation

## 2023-09-27 DIAGNOSIS — K640 First degree hemorrhoids: Secondary | ICD-10-CM | POA: Insufficient documentation

## 2023-09-27 HISTORY — PX: COLONOSCOPY: SHX5424

## 2023-09-27 LAB — GLUCOSE, CAPILLARY: Glucose-Capillary: 119 mg/dL — ABNORMAL HIGH (ref 70–99)

## 2023-09-27 SURGERY — COLONOSCOPY
Anesthesia: General

## 2023-09-27 MED ORDER — GLYCOPYRROLATE 0.2 MG/ML IJ SOLN
INTRAMUSCULAR | Status: AC
  Filled 2023-09-27: qty 1

## 2023-09-27 MED ORDER — GLYCOPYRROLATE 0.2 MG/ML IJ SOLN
INTRAMUSCULAR | Status: DC | PRN
  Administered 2023-09-27: .2 mg via INTRAVENOUS

## 2023-09-27 MED ORDER — LIDOCAINE HCL (PF) 2 % IJ SOLN
INTRAMUSCULAR | Status: AC
  Filled 2023-09-27: qty 5

## 2023-09-27 MED ORDER — SODIUM CHLORIDE 0.9 % IV SOLN
INTRAVENOUS | Status: DC
  Administered 2023-09-27: 20 mL/h via INTRAVENOUS

## 2023-09-27 MED ORDER — PROPOFOL 10 MG/ML IV BOLUS
INTRAVENOUS | Status: AC
  Filled 2023-09-27: qty 20

## 2023-09-27 MED ORDER — EPHEDRINE SULFATE-NACL 50-0.9 MG/10ML-% IV SOSY
PREFILLED_SYRINGE | INTRAVENOUS | Status: DC | PRN
  Administered 2023-09-27: 5 mg via INTRAVENOUS

## 2023-09-27 MED ORDER — LIDOCAINE HCL (CARDIAC) PF 100 MG/5ML IV SOSY
PREFILLED_SYRINGE | INTRAVENOUS | Status: DC | PRN
  Administered 2023-09-27: 60 mg via INTRAVENOUS

## 2023-09-27 MED ORDER — PROPOFOL 10 MG/ML IV BOLUS
INTRAVENOUS | Status: DC | PRN
  Administered 2023-09-27: 30 mg via INTRAVENOUS
  Administered 2023-09-27: 20 mg via INTRAVENOUS
  Administered 2023-09-27: 30 mg via INTRAVENOUS
  Administered 2023-09-27: 50 mg via INTRAVENOUS
  Administered 2023-09-27: 30 mg via INTRAVENOUS
  Administered 2023-09-27: 50 mg via INTRAVENOUS
  Administered 2023-09-27: 120 mg via INTRAVENOUS
  Administered 2023-09-27: 20 mg via INTRAVENOUS

## 2023-09-27 NOTE — Op Note (Signed)
 [facility] Gastroenterology Patient Name: Tyrone Jarvis Procedure Date: 09/27/2023 9:36 AM MRN: [id number] Account #: [id number] Date of Birth: [birth year] Admit Type: Outpatient Age: 73 Room: [facility] ENDO ROOM 3 Gender: Male Note Status: Finalized Instrument Name: Charlyn Cooley [id number] Procedure:             Colonoscopy Indications:           Surveillance: History of adenomatous polyps,                         inadequate prep on last exam (<39yr) Providers:             Leida Puna MD, MD Medicines:             Monitored Anesthesia Care Complications:         No immediate complications. Procedure:             Pre-Anesthesia Assessment:                        - Prior to the procedure, a History and Physical was                         performed, and patient medications and allergies were                         reviewed. The patient is competent. The risks and                         benefits of the procedure and the sedation options and                         risks were discussed with the patient. All questions                         were answered and informed consent was obtained.                         Patient identification and proposed procedure were                         verified by the physician, the nurse, the                         anesthesiologist, the anesthetist and the technician                         in the endoscopy suite. Mental Status Examination:                         alert and oriented. Airway Examination: normal                         oropharyngeal airway and neck mobility. Respiratory                         Examination: clear to auscultation. CV Examination:                         normal. Prophylactic Antibiotics: The patient does not  require prophylactic antibiotics. Prior                         Anticoagulants: The patient has taken no anticoagulant                         or antiplatelet agents. ASA Grade  Assessment: III - A                         patient with severe systemic disease. After reviewing                         the risks and benefits, the patient was deemed in                         satisfactory condition to undergo the procedure. The                         anesthesia plan was to use monitored anesthesia care                         (MAC). Immediately prior to administration of                         medications, the patient was re-assessed for adequacy                         to receive sedatives. The heart rate, respiratory                         rate, oxygen saturations, blood pressure, adequacy of                         pulmonary ventilation, and response to care were                         monitored throughout the procedure. The physical                         status of the patient was re-assessed after the                         procedure.                        After obtaining informed consent, the colonoscope was                         passed under direct vision. Throughout the procedure,                         the patient's blood pressure, pulse, and oxygen                         saturations were monitored continuously. The                         Colonoscope was introduced through the anus and  advanced to the the cecum, identified by appendiceal                         orifice and ileocecal valve. The ileocecal valve,                         appendiceal orifice, and rectum were photographed. The                         colonoscopy was somewhat difficult due to significant                         looping. Successful completion of the procedure was                         aided by applying abdominal pressure. The patient                         tolerated the procedure well. The quality of the bowel                         preparation was adequate to identify polyps. Findings:      The perianal and digital rectal examinations were  normal.      A single large-mouthed diverticulum was found in the ascending colon.      Multiple medium-mouthed and small-mouthed diverticula were found in the       sigmoid colon and descending colon.      Internal hemorrhoids were found during retroflexion. The hemorrhoids       were Grade I (internal hemorrhoids that do not prolapse).      The exam was otherwise without abnormality on direct and retroflexion       views. Impression:            - Diverticulosis in the ascending colon.                        - Diverticulosis in the sigmoid colon and in the                         descending colon.                        - Internal hemorrhoids.                        - The examination was otherwise normal on direct and                         retroflexion views.                        - No specimens collected. Recommendation:        - Discharge patient to home.                        - Resume previous diet.                        - Continue present medications.                        -  Repeat colonoscopy is not recommended due to current                         age (13 years or older) for surveillance.                        - Return to referring physician as previously                         scheduled. Procedure Code(s):     --- Professional ---                        X3235, Colorectal cancer screening; colonoscopy on                         individual at high risk Diagnosis Code(s):     --- Professional ---                        Z86.010, Personal history of colonic polyps                        K64.0, First degree hemorrhoids                        K57.30, Diverticulosis of large intestine without                         perforation or abscess without bleeding CPT copyright 2022 [employer]. All rights reserved. The codes documented in this report are preliminary and upon coder review may  be revised to meet current compliance requirements. Leida Puna MD,  MD 09/27/2023 10:29:42 AM Number of Addenda: 0 Note Initiated On: 09/27/2023 9:36 AM Scope Withdrawal Time: 0 hours 8 minutes 32 seconds  Total Procedure Duration: 0 hours 19 minutes 46 seconds  Estimated Blood Loss:  Estimated blood loss: none.      [facility]

## 2023-09-27 NOTE — Interval H&P Note (Signed)
 History and Physical Interval Note:  09/27/2023 9:40 AM  Tyrone Jarvis  has presented today for surgery, with the diagnosis of Z86.0101 (ICD-10-CM) - History of adenomatous polyp of colon.  The various methods of treatment have been discussed with the patient and family. After consideration of risks, benefits and other options for treatment, the patient has consented to  Procedure(s) with comments: COLONOSCOPY (N/A) - DM   (Hard of Hearing) as a surgical intervention.  The patient's history has been reviewed, patient examined, no change in status, stable for surgery.  I have reviewed the patient's chart and labs.  Questions were answered to the patient's satisfaction.     Shane Darling  Ok to proceed with colonoscopy

## 2023-09-27 NOTE — Anesthesia Preprocedure Evaluation (Signed)
 Anesthesia Evaluation  Patient identified by MRN, date of birth, ID band Patient awake    Reviewed: Allergy & Precautions, H&P , NPO status , Patient's Chart, lab work & pertinent test results, reviewed documented beta blocker date and time   Airway Mallampati: II   Neck ROM: full    Dental  (+) Poor Dentition   Pulmonary neg pulmonary ROS   Pulmonary exam normal        Cardiovascular Exercise Tolerance: Good hypertension, On Medications negative cardio ROS Normal cardiovascular exam Rhythm:regular Rate:Normal     Neuro/Psych negative neurological ROS  negative psych ROS   GI/Hepatic negative GI ROS, Neg liver ROS,,,  Endo/Other  negative endocrine ROSdiabetes, Well Controlled    Renal/GU negative Renal ROS  negative genitourinary   Musculoskeletal   Abdominal   Peds  Hematology negative hematology ROS (+)   Anesthesia Other Findings Past Medical History: No date: Diabetes mellitus, type 2 (HCC) No date: Hyperlipidemia No date: Hypertension Past Surgical History: 06/12/2019: KNEE BURSECTOMY; Left     Comment:  Procedure: LEFT KNEE BURSECTOMY excision of meniscus or               capsularis per Tiffany;  Surgeon: Jerlyn Moons, MD;                Location: [facility];  Service: Orthopedics;                Laterality: Left;  Diabetic - oral meds No date: KNEE SURGERY; Bilateral No date: ROTATOR CUFF REPAIR; Right BMI    Body Mass Index: 25.00 kg/m     Reproductive/Obstetrics negative OB ROS                             Anesthesia Physical Anesthesia Plan  ASA: 3  Anesthesia Plan: General   Post-op Pain Management:    Induction:   PONV Risk Score and Plan:   Airway Management Planned:   Additional Equipment:   Intra-op Plan:   Post-operative Plan:   Informed Consent: I have reviewed the patients History and Physical, chart, labs and discussed the procedure  including the risks, benefits and alternatives for the proposed anesthesia with the patient or authorized representative who has indicated his/her understanding and acceptance.     Dental Advisory Given  Plan Discussed with: CRNA  Anesthesia Plan Comments:        Anesthesia Quick Evaluation

## 2023-09-27 NOTE — Transfer of Care (Signed)
 Immediate Anesthesia Transfer of Care Note  Patient: Tyrone Jarvis  Procedure(s) Performed: COLONOSCOPY  Patient Location: PACU and Endoscopy Unit  Anesthesia Type:General  Level of Consciousness: drowsy and responds to stimulation  Airway & Oxygen Therapy: Patient Spontanous Breathing  Post-op Assessment: Report given to RN and Post -op Vital signs reviewed and stable  Post vital signs: Reviewed and stable  Last Vitals:  Vitals Value Taken Time  BP 101/49 09/27/23 1028  Temp 36.1 C 09/27/23 1028  Pulse 66 09/27/23 1029  Resp 18 09/27/23 1029  SpO2 98 % 09/27/23 1029  Vitals shown include unfiled device data.  Last Pain:  Vitals:   09/27/23 1028  TempSrc: Temporal  PainSc: Asleep         Complications: No notable events documented.

## 2023-09-27 NOTE — H&P (Signed)
 Outpatient short stay form Pre-procedure 09/27/2023  Shane Darling, MD  Primary Physician: Arletta Lager, MD  Reason for visit:  Surveillance  History of present illness:    73 y/o gentleman with history of HLD, DM II, and hypertension here for surveillance colonoscopy. Last colonoscopy last year with poor prep. No blood thinners. No family history of GI malignancies. No significant abdominal surgeries.    Current Facility-Administered Medications:    0.9 %  sodium chloride  infusion, , Intravenous, Continuous, Taran Haynesworth, Leanora Prophet, MD, Last Rate: 20 mL/hr at 09/27/23 0828, 20 mL/hr at 09/27/23 1914  Medications Prior to Admission  Medication Sig Dispense Refill Last Dose/Taking   celecoxib  (CELEBREX ) 200 MG capsule Take 1 capsule (200 mg total) by mouth 2 (two) times daily. 20 capsule 0 Past Week   cyanocobalamin (,VITAMIN B-12,) 1000 MCG/ML injection Inject 1 mL into the muscle every 30 (thirty) days. Takes on the 25th of each month  12 Past Week   dorzolamide (TRUSOPT) 2 % ophthalmic solution Place 1 drop into both eyes 2 (two) times daily.   Past Week   doxycycline  (VIBRAMYCIN ) 100 MG capsule Take 1 capsule (100 mg total) by mouth every 12 (twelve) hours. 28 capsule 0 Past Week   Fluocinolone Acetonide Scalp 0.01 % OIL Apply 1 application topically 2 (two) times daily.  11 Past Week   ketoconazole (NIZORAL) 2 % cream Apply 1 application topically 2 (two) times daily.  6 Past Week   losartan  (COZAAR ) 25 MG tablet Take 25 mg by mouth daily.  3 Past Week   metFORMIN (GLUCOPHAGE) 500 MG tablet Take 500 mg by mouth daily with breakfast.  3 Past Week   nystatin -triamcinolone  ointment (MYCOLOG) Apply 1 Application topically 2 (two) times daily. 30 g 0 Past Week   Omega-3 Fatty Acids (FISH OIL) 1000 MG CAPS Take 1 capsule by mouth 2 (two) times daily.   Past Week   rosuvastatin  (CRESTOR ) 5 MG tablet Take 5 mg by mouth at bedtime.  3 Past Week   tadalafil (CIALIS) 10 MG tablet SMARTSIG:2  Tablet(s) By Mouth PRN   Past Week   traZODone  (DESYREL ) 50 MG tablet Take 50 mg by mouth at bedtime.  3 Past Week     No Known Allergies   Past Medical History:  Diagnosis Date   Diabetes mellitus, type 2 (HCC)    Hyperlipidemia    Hypertension     Review of systems:  Otherwise negative.    Physical Exam  Gen: Alert, oriented. Appears stated age.  HEENT: PERRLA. Lungs: No respiratory distress CV: RRR Abd: soft, benign, no masses Ext: No edema    Planned procedures: Proceed with colonoscopy. The patient understands the nature of the planned procedure, indications, risks, alternatives and potential complications including but not limited to bleeding, infection, perforation, damage to internal organs and possible oversedation/side effects from anesthesia. The patient agrees and gives consent to proceed.  Please refer to procedure notes for findings, recommendations and patient disposition/instructions.     Shane Darling, MD [facility] Gastroenterology

## 2023-09-28 ENCOUNTER — Encounter: Payer: Self-pay | Admitting: Gastroenterology

## 2023-09-29 NOTE — Anesthesia Postprocedure Evaluation (Signed)
 Anesthesia Post Note  Patient: Tyrone Jarvis  Procedure(s) Performed: COLONOSCOPY  Patient location during evaluation: PACU Anesthesia Type: General Level of consciousness: awake and alert Pain management: pain level controlled Vital Signs Assessment: post-procedure vital signs reviewed and stable Respiratory status: spontaneous breathing, nonlabored ventilation, respiratory function stable and patient connected to nasal cannula oxygen Cardiovascular status: blood pressure returned to baseline and stable Postop Assessment: no apparent nausea or vomiting Anesthetic complications: no   No notable events documented.   Last Vitals:  Vitals:   09/27/23 1038 09/27/23 1048  BP: 117/87 128/82  Pulse:    Resp:    Temp:    SpO2:      Last Pain:  Vitals:   09/27/23 1048  TempSrc:   PainSc: 0-No pain                 Zula Hitch
